# Patient Record
Sex: Male | Born: 1990 | Race: Black or African American | Hispanic: No | Marital: Single | Smoking: Never smoker
Health system: Southern US, Community
[De-identification: ages and names within clinical notes are randomized; demographics above are authoritative.]

## PROBLEM LIST (undated history)

## (undated) DIAGNOSIS — B20 Human immunodeficiency virus [HIV] disease: Secondary | ICD-10-CM

## (undated) DIAGNOSIS — E876 Hypokalemia: Secondary | ICD-10-CM

## (undated) DIAGNOSIS — Z21 Asymptomatic human immunodeficiency virus [HIV] infection status: Secondary | ICD-10-CM

## (undated) HISTORY — DX: Asymptomatic human immunodeficiency virus (hiv) infection status: Z21

## (undated) HISTORY — DX: Hypokalemia: E87.6

## (undated) HISTORY — DX: Human immunodeficiency virus (HIV) disease: B20

---
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
## 2011-05-04 ENCOUNTER — Inpatient Hospital Stay (INDEPENDENT_AMBULATORY_CARE_PROVIDER_SITE_OTHER)
Admission: RE | Admit: 2011-05-04 | Discharge: 2011-05-04 | Disposition: A | Discharge status: Home / Self Care | Payer: 59 | Source: Ambulatory Visit | Attending: Family Medicine | Admitting: Family Medicine

## 2011-05-04 DIAGNOSIS — K5289 Other specified noninfective gastroenteritis and colitis: Secondary | ICD-10-CM

## 2011-07-14 ENCOUNTER — Inpatient Hospital Stay (INDEPENDENT_AMBULATORY_CARE_PROVIDER_SITE_OTHER)
Admission: RE | Admit: 2011-07-14 | Discharge: 2011-07-14 | Disposition: A | Discharge status: Home / Self Care | Payer: 59 | Source: Ambulatory Visit | Attending: Family Medicine | Admitting: Family Medicine

## 2011-07-14 DIAGNOSIS — J029 Acute pharyngitis, unspecified: Secondary | ICD-10-CM

## 2011-07-14 LAB — POCT INFECTIOUS MONO SCREEN: Mono Screen: NEGATIVE

## 2011-07-14 LAB — POCT RAPID STREP A: Streptococcus, Group A Screen (Direct): NEGATIVE

## 2011-07-14 LAB — HIV ANTIBODY (ROUTINE TESTING W REFLEX): HIV: NONREACTIVE

## 2011-09-17 ENCOUNTER — Emergency Department: Payer: Self-pay | Admitting: Emergency Medicine

## 2011-09-25 ENCOUNTER — Emergency Department: Payer: Self-pay

## 2013-05-02 ENCOUNTER — Emergency Department: Payer: Self-pay | Admitting: Emergency Medicine

## 2013-05-02 LAB — COMPREHENSIVE METABOLIC PANEL
Alkaline Phosphatase: 106 U/L (ref 50–136)
BUN: 12 mg/dL (ref 7–18)
Bilirubin,Total: 1.7 mg/dL — ABNORMAL HIGH (ref 0.2–1.0)
Calcium, Total: 9.4 mg/dL (ref 8.5–10.1)
Chloride: 103 mmol/L (ref 98–107)
Glucose: 91 mg/dL (ref 65–99)
Osmolality: 269 (ref 275–301)
Potassium: 3 mmol/L — ABNORMAL LOW (ref 3.5–5.1)
SGOT(AST): 92 U/L — ABNORMAL HIGH (ref 15–37)
Sodium: 135 mmol/L — ABNORMAL LOW (ref 136–145)
Total Protein: 8.2 g/dL (ref 6.4–8.2)

## 2013-05-02 LAB — CBC
HCT: 45.5 % (ref 40.0–52.0)
MCH: 27.8 pg (ref 26.0–34.0)
MCHC: 35.7 g/dL (ref 32.0–36.0)
Platelet: 202 10*3/uL (ref 150–440)
RDW: 13.7 % (ref 11.5–14.5)

## 2013-05-12 ENCOUNTER — Emergency Department: Payer: Self-pay | Admitting: Emergency Medicine

## 2013-05-12 LAB — COMPREHENSIVE METABOLIC PANEL
Albumin: 4.1 g/dL (ref 3.4–5.0)
Anion Gap: 6 — ABNORMAL LOW (ref 7–16)
Bilirubin,Total: 0.9 mg/dL (ref 0.2–1.0)
Calcium, Total: 9.1 mg/dL (ref 8.5–10.1)
Co2: 27 mmol/L (ref 21–32)
EGFR (African American): >60
Osmolality: 272 (ref 275–301)
Potassium: 2.6 mmol/L — ABNORMAL LOW (ref 3.5–5.1)
SGPT (ALT): 127 U/L — ABNORMAL HIGH (ref 12–78)
Sodium: 135 mmol/L — ABNORMAL LOW (ref 136–145)

## 2013-05-12 LAB — CBC
HCT: 38.9 % — ABNORMAL LOW (ref 40.0–52.0)
HGB: 13.8 g/dL (ref 13.0–18.0)
MCH: 27.7 pg (ref 26.0–34.0)
MCV: 78 fL — ABNORMAL LOW (ref 80–100)
WBC: 10.2 10*3/uL (ref 3.8–10.6)

## 2013-05-12 LAB — LIPASE, BLOOD: Lipase: 92 U/L (ref 73–393)

## 2013-05-14 DIAGNOSIS — E876 Hypokalemia: Secondary | ICD-10-CM

## 2013-05-14 HISTORY — DX: Hypokalemia: E87.6

## 2013-05-19 ENCOUNTER — Telehealth: Payer: Self-pay | Admitting: Adult Health

## 2013-05-19 ENCOUNTER — Encounter: Payer: Self-pay | Admitting: Adult Health

## 2013-05-19 NOTE — Telephone Encounter (Signed)
Left message on home answering machine to call the office . I'm needing the patient to reschedule his appointment from 7.23.14 @ 8:30.

## 2013-05-23 ENCOUNTER — Telehealth: Payer: Self-pay | Admitting: *Deleted

## 2013-05-23 NOTE — Telephone Encounter (Signed)
Mother called stating patient was to have had new patient appt tomorrow but this was rescheduled to 7/29.  She is concerned because pt has been seen at [facility] twice this month for low potassium, the last time was last week and he was told to see you asap.  His potassium at hospital was 2.6, was given IV potassium and a script for a supplement, which he has finished.  Mother is asking if ok to wait till next week for patient to be seen.  Please advise. Phone number for pt is [phone].

## 2013-05-23 NOTE — Telephone Encounter (Signed)
Per Raquel, advised patient that he will not be able to have labs checked here until after he becomes established as a patient.  I suggested he go to his previous doctor, if he has one, but he says that would be his pediatrician, and he has aged out.  He states that he's ok with waiting until his visit here next week, but I advised him that if he starts feeling bad again he should go to urgent care for labs.  Patient agreed.

## 2013-05-23 NOTE — Telephone Encounter (Signed)
Unfortunately I cannot answer whether it is ok to wait until next week because I do not know the history of the patient. I do not have any new patient appointments until then.

## 2013-05-23 NOTE — Telephone Encounter (Signed)
Advised patient as instructed.  He's asking if he can at least come in and have potassium level checked prior to appt next week.

## 2013-05-24 ENCOUNTER — Ambulatory Visit: Payer: 59 | Admitting: Adult Health

## 2013-05-30 ENCOUNTER — Other Ambulatory Visit: Payer: Self-pay | Admitting: Adult Health

## 2013-05-30 ENCOUNTER — Ambulatory Visit (INDEPENDENT_AMBULATORY_CARE_PROVIDER_SITE_OTHER): Payer: 59 | Admitting: Adult Health

## 2013-05-30 ENCOUNTER — Encounter: Payer: Self-pay | Admitting: Adult Health

## 2013-05-30 VITALS — BP 108/62 | HR 74 | Temp 98.0°F | Resp 12 | Ht 69.0 in | Wt 132.0 lb

## 2013-05-30 DIAGNOSIS — Z0000 Encounter for general adult medical examination without abnormal findings: Secondary | ICD-10-CM

## 2013-05-30 LAB — CBC WITH DIFFERENTIAL/PLATELET
Basophils Relative: 0.7 % (ref 0.0–3.0)
Eosinophils Absolute: 0.1 10*3/uL (ref 0.0–0.7)
Hemoglobin: 14.3 g/dL (ref 13.0–17.0)
Lymphocytes Relative: 39.4 % (ref 12.0–46.0)
MCHC: 33.8 g/dL (ref 30.0–36.0)
MCV: 83.6 fl (ref 78.0–100.0)
Neutro Abs: 4 10*3/uL (ref 1.4–7.7)
RBC: 5.08 Mil/uL (ref 4.22–5.81)

## 2013-05-30 LAB — LIPID PANEL
HDL: 30.5 mg/dL — ABNORMAL LOW (ref >39.00)
Total CHOL/HDL Ratio: 5
VLDL: 28.8 mg/dL (ref 0.0–40.0)

## 2013-05-30 LAB — COMPREHENSIVE METABOLIC PANEL
AST: 19 U/L (ref 0–37)
Alkaline Phosphatase: 76 U/L (ref 39–117)
BUN: 9 mg/dL (ref 6–23)
Calcium: 9.2 mg/dL (ref 8.4–10.5)
Creatinine, Ser: 0.8 mg/dL (ref 0.4–1.5)
Total Bilirubin: 1 mg/dL (ref 0.3–1.2)

## 2013-05-30 MED ORDER — TETANUS-DIPHTH-ACELL PERTUSSIS 5-2.5-18.5 LF-MCG/0.5 IM SUSP: 0.5000 mL | Freq: Once | INTRAMUSCULAR | Status: DC

## 2013-05-30 MED ORDER — POTASSIUM CHLORIDE CRYS ER 20 MEQ PO TBCR: 20.0000 meq | EXTENDED_RELEASE_TABLET | Freq: Every day | ORAL | Status: DC

## 2013-05-30 NOTE — Progress Notes (Signed)
  Subjective:    Patient ID: Tyler Shannon, male    DOB: 25-Nov-1990, 22 y.o.   MRN: [id number]  HPI  Patient is a pleasant 22 y/o male who presents to clinic to establish care. Last MD was his pediatrician - Dr. Tracey Harries in [place]. [place] recently had food poisoning which resulted in n/v. He ended up in the ED. He was found to be hypokalemic. He was given supplements which he has completed.  Review of Systems  Constitutional: Negative.   HENT: Negative.   Eyes: Negative.   Respiratory: Negative.   Cardiovascular: Negative.   Gastrointestinal: Negative.   Endocrine: Negative.   Genitourinary: Negative.   Musculoskeletal: Negative.   Skin: Negative.   Allergic/Immunologic:       Seasonal allergies during high allergy season  Neurological: Negative.   Hematological: Negative.   Psychiatric/Behavioral: Negative.    BP 108/62  Pulse 74  Temp(Src) 98 F (36.7 C) (Oral)  Resp 12  Ht 5\' 9"  (1.753 m)  Wt 132 lb (59.875 kg)  BMI 19.48 kg/m2  SpO2 99%    Objective:   Physical Exam  Constitutional: He is oriented to person, place, and time. He appears well-developed and well-nourished. No distress.  HENT:  Head: Normocephalic and atraumatic.  Right Ear: External ear normal.  Left Ear: External ear normal.  Nose: Nose normal.  Mouth/Throat: Oropharynx is clear and moist.  Eyes: Conjunctivae and EOM are normal. Pupils are equal, round, and reactive to light.  Neck: Normal range of motion. Neck supple. No tracheal deviation present. No thyromegaly present.  Cardiovascular: Normal rate, regular rhythm, normal heart sounds and intact distal pulses.  Exam reveals no gallop and no friction rub.   No murmur heard. Pulmonary/Chest: Effort normal and breath sounds normal. No respiratory distress. He has no wheezes. He has no rales.  Abdominal: Soft. Bowel sounds are normal. He exhibits no distension and no mass. There is no tenderness. There is no rebound and no guarding.    Musculoskeletal: Normal range of motion. He exhibits no edema and no tenderness.  Lymphadenopathy:    He has no cervical adenopathy.  Neurological: He is alert and oriented to person, place, and time. He has normal reflexes. No cranial nerve deficit. Coordination normal.  Skin: Skin is warm and dry.     Psychiatric: He has a normal mood and affect. His behavior is normal. Judgment and thought content normal.          Assessment & Plan:

## 2013-05-30 NOTE — Addendum Note (Signed)
Addended by: Chandra Batch E on: 05/30/2013 10:33 AM   Modules accepted: Orders

## 2013-05-30 NOTE — Assessment & Plan Note (Addendum)
Normal physical exam. Check labs: CBC with differential, lipids, comprehensive metabolic panel. Patient with recent history of hypokalemia secondary to significant nausea and vomiting after food poisoning. He is feeling well. Obtain medical records from Dr. Tracey Harries. Patient will be given Tdap vaccine today. RTC in 1 year or sooner if needed

## 2013-05-30 NOTE — Patient Instructions (Addendum)
   Thank you for choosing [facility] for your health care needs.  Please have your labs drawn prior to leaving the office.  The results will be available through MyChart for your convenience. Please remember to activate this. The activation code is located at the end of this form.  Please call with any questions or concerns.

## 2013-06-01 ENCOUNTER — Other Ambulatory Visit: Payer: Self-pay | Admitting: *Deleted

## 2013-06-01 ENCOUNTER — Ambulatory Visit: Payer: 59 | Admitting: Adult Health

## 2013-06-01 MED ORDER — POTASSIUM CHLORIDE CRYS ER 20 MEQ PO TBCR: 20.0000 meq | EXTENDED_RELEASE_TABLET | Freq: Every day | ORAL | Status: DC

## 2013-06-01 NOTE — Telephone Encounter (Signed)
Rx changed to no refills per Orville Govern, NP. New Rx escripted to [facility].

## 2013-06-07 ENCOUNTER — Telehealth: Payer: Self-pay | Admitting: *Deleted

## 2013-06-07 ENCOUNTER — Other Ambulatory Visit: Payer: Self-pay | Admitting: Adult Health

## 2013-06-07 DIAGNOSIS — E876 Hypokalemia: Secondary | ICD-10-CM

## 2013-06-07 NOTE — Telephone Encounter (Signed)
potassium

## 2013-06-07 NOTE — Telephone Encounter (Signed)
Pt is coming in for labs work tomorrow 08.07.2014, what labs and dx?

## 2013-06-08 ENCOUNTER — Other Ambulatory Visit (INDEPENDENT_AMBULATORY_CARE_PROVIDER_SITE_OTHER): Payer: 59

## 2013-06-08 DIAGNOSIS — E876 Hypokalemia: Secondary | ICD-10-CM

## 2013-06-08 LAB — POTASSIUM: Potassium: 3.7 mEq/L (ref 3.5–5.1)

## 2013-06-09 ENCOUNTER — Encounter: Payer: Self-pay | Admitting: Adult Health

## 2013-08-14 ENCOUNTER — Ambulatory Visit: Payer: 59 | Admitting: Internal Medicine

## 2013-08-14 ENCOUNTER — Encounter: Payer: Self-pay | Admitting: Family Medicine

## 2013-08-14 ENCOUNTER — Telehealth (INDEPENDENT_AMBULATORY_CARE_PROVIDER_SITE_OTHER): Payer: 59 | Admitting: Family Medicine

## 2013-08-14 VITALS — BP 126/84 | HR 96 | Temp 98.4°F | Wt 134.0 lb

## 2013-08-14 DIAGNOSIS — A088 Other specified intestinal infections: Secondary | ICD-10-CM

## 2013-08-14 DIAGNOSIS — A084 Viral intestinal infection, unspecified: Secondary | ICD-10-CM | POA: Insufficient documentation

## 2013-08-14 DIAGNOSIS — Z113 Encounter for screening for infections with a predominantly sexual mode of transmission: Secondary | ICD-10-CM | POA: Insufficient documentation

## 2013-08-14 NOTE — Progress Notes (Signed)
  Subjective:    Patient ID: Tyler Shannon, male    DOB: May 28, 1991, 22 y.o.   MRN: [id number]  HPI CC: not feeling well.  Presents with sister today.  1d h/o diarrhea associated with headache and sweating.  Diarrhea x2-3/day - watery.  Last BM was yesterday morning.  No blood or mucous in stool.  When he awoke this morning - felt nauseated, persistent headache.  Took 2 advil this morning.  No diarrhea in last few days 2/2 not eating much.  Has slept most of the day last 2 days.  Tmax 101.5.  Appetite down, not eating much. Recently went to hospital with mom for kidney stone.  No sick contacts at home. No new foods or restaurants. Traveled to [place] 3 wks ago.  Denies vomiting, new rashes, no abd pain.  No viral URI sxs like cough or sneezing, congestion. Would like STD check - 3-4 partners in last year (male).  Unprotected x2.  Last void was 5 min ago.  No dysuria, urgency, frequency.  Past Medical History  Diagnosis Date  . Hypokalemia 05/14/13    Review of Systems Per HPI    Objective:   Physical Exam  Nursing note and vitals reviewed. Constitutional: He appears well-developed and well-nourished. No distress.  HENT:  Mouth/Throat: Oropharynx is clear and moist.  Cardiovascular: Normal rate, regular rhythm, normal heart sounds and intact distal pulses.   No murmur heard. Pulmonary/Chest: Effort normal and breath sounds normal. No respiratory distress. He has no wheezes. He has no rales.  Abdominal: Soft. Normal appearance and bowel sounds are normal. He exhibits no distension and no mass. There is no hepatosplenomegaly. There is no tenderness. There is no rigidity, no rebound, no guarding, no CVA tenderness and negative Murphy's sign.  thin  Genitourinary: Penis normal. Circumcised.  Musculoskeletal: He exhibits no edema.  Skin: Skin is warm and dry. No rash noted.  Psychiatric: He has a normal mood and affect.       Assessment & Plan:

## 2013-08-14 NOTE — Assessment & Plan Note (Signed)
Supportive care as per instructions.  Handout provided. Update if not improving as expected.

## 2013-08-14 NOTE — Assessment & Plan Note (Signed)
Reviewed high risk sexual activity, advised protection 100%. Advised possibility of false negative HIV test today depending on last episode of unprotected sex and possible need of repeating testing. CT/GC urethral probe, HIV,RPR blood test.

## 2013-08-14 NOTE — Patient Instructions (Signed)
STD screen today.  Careful with sex. I think you had viral gastroenteritis.  See below Viral Gastroenteritis Viral gastroenteritis is also known as stomach flu. This condition affects the stomach and intestinal tract. It can cause sudden diarrhea and vomiting. The illness typically lasts 3 to 8 days. Most people develop an immune response that eventually gets rid of the virus. While this natural response develops, the virus can make you quite ill. CAUSES  Many different viruses can cause gastroenteritis, such as rotavirus or noroviruses. You can catch one of these viruses by consuming contaminated food or water. You may also catch a virus by sharing utensils or other personal items with an infected person or by touching a contaminated surface. SYMPTOMS  The most common symptoms are diarrhea and vomiting. These problems can cause a severe loss of body fluids (dehydration) and a body salt (electrolyte) imbalance. Other symptoms may include:  Fever.  Headache.  Fatigue.  Abdominal pain. DIAGNOSIS  Your caregiver can usually diagnose viral gastroenteritis based on your symptoms and a physical exam. A stool sample may also be taken to test for the presence of viruses or other infections. TREATMENT  This illness typically goes away on its own. Treatments are aimed at rehydration. The most serious cases of viral gastroenteritis involve vomiting so severely that you are not able to keep fluids down. In these cases, fluids must be given through an intravenous line (IV). HOME CARE INSTRUCTIONS   Drink enough fluids to keep your urine clear or pale yellow. Drink small amounts of fluids frequently and increase the amounts as tolerated.  Ask your caregiver for specific rehydration instructions.  Avoid:  Foods high in sugar.  Alcohol.  Carbonated drinks.  Tobacco.  Juice.  Caffeine drinks.  Extremely hot or cold fluids.  Fatty, greasy foods.  Too much intake of anything at one  time.  Dairy products until 24 to 48 hours after diarrhea stops.  You may consume probiotics. Probiotics are active cultures of beneficial bacteria. They may lessen the amount and number of diarrheal stools in adults. Probiotics can be found in yogurt with active cultures and in supplements.  Wash your hands well to avoid spreading the virus.  Only take over-the-counter or prescription medicines for pain, discomfort, or fever as directed by your caregiver. Do not give aspirin to children. Antidiarrheal medicines are not recommended.  Ask your caregiver if you should continue to take your regular prescribed and over-the-counter medicines.  Keep all follow-up appointments as directed by your caregiver. SEEK IMMEDIATE MEDICAL CARE IF:   You are unable to keep fluids down.  You do not urinate at least once every 6 to 8 hours.  You develop shortness of breath.  You notice blood in your stool or vomit. This may look like coffee grounds.  You have abdominal pain that increases or is concentrated in one small area (localized).  You have persistent vomiting or diarrhea.  You have a fever.  The patient is a child younger than 3 months, and he or she has a fever.  The patient is a child older than 3 months, and he or she has a fever and persistent symptoms.  The patient is a child older than 3 months, and he or she has a fever and symptoms suddenly get worse.  The patient is a baby, and he or she has no tears when crying. MAKE SURE YOU:   Understand these instructions.  Will watch your condition.  Will get help right away if you  are not doing well or get worse. Document Released: 10/19/2005 Document Revised: 01/11/2012 Document Reviewed: 08/05/2011 [facility] Patient Information 2014 [place].

## 2013-08-15 LAB — GC/CHLAMYDIA PROBE AMP: CT Probe RNA: NEGATIVE

## 2013-08-16 ENCOUNTER — Ambulatory Visit (INDEPENDENT_AMBULATORY_CARE_PROVIDER_SITE_OTHER): Payer: 59 | Admitting: Internal Medicine

## 2013-08-16 ENCOUNTER — Encounter: Payer: Self-pay | Admitting: Internal Medicine

## 2013-08-16 VITALS — BP 100/60 | HR 74 | Temp 97.6°F | Wt 133.0 lb

## 2013-08-16 DIAGNOSIS — R509 Fever, unspecified: Secondary | ICD-10-CM

## 2013-08-16 DIAGNOSIS — R634 Abnormal weight loss: Secondary | ICD-10-CM

## 2013-08-16 LAB — CBC WITH DIFFERENTIAL/PLATELET
Basophils Absolute: 0 10*3/uL (ref 0.0–0.1)
HCT: 44.2 % (ref 39.0–52.0)
Lymphs Abs: 1.2 10*3/uL (ref 0.7–4.0)
MCHC: 34.4 g/dL (ref 30.0–36.0)
MCV: 81.6 fl (ref 78.0–100.0)
Monocytes Absolute: 0.6 10*3/uL (ref 0.1–1.0)
Platelets: 115 10*3/uL — ABNORMAL LOW (ref 150.0–400.0)
RDW: 13.3 % (ref 11.5–14.6)

## 2013-08-16 LAB — SEDIMENTATION RATE: Sed Rate: 8 mm/hr (ref 0–22)

## 2013-08-16 LAB — BASIC METABOLIC PANEL
BUN: 6 mg/dL (ref 6–23)
CO2: 30 mEq/L (ref 19–32)
Chloride: 99 mEq/L (ref 96–112)
Glucose, Bld: 104 mg/dL — ABNORMAL HIGH (ref 70–99)
Potassium: 3.9 mEq/L (ref 3.5–5.1)

## 2013-08-16 LAB — TSH: TSH: 1.25 u[IU]/mL (ref 0.35–5.50)

## 2013-08-16 LAB — HEPATIC FUNCTION PANEL
Albumin: 4.3 g/dL (ref 3.5–5.2)
Total Protein: 7.6 g/dL (ref 6.0–8.3)

## 2013-08-16 MED ORDER — ONDANSETRON HCL 4 MG PO TABS: 4.0000 mg | ORAL_TABLET | Freq: Three times a day (TID) | ORAL | Status: DC | PRN

## 2013-08-16 NOTE — Assessment & Plan Note (Signed)
Will check labs Not sure if low potassium could be related--- his sister and mom have also had low potassium

## 2013-08-16 NOTE — Assessment & Plan Note (Signed)
Seems to be viral syndrome Headache, nausea without vomiting except for this AM No rash, significant respiratory symptoms, foreign travel or apparent exposures  ?mosquito borne illness No evidence of bacterial infection Has had some weight loss and puzzling hypokalemia Will recheck labs

## 2013-08-16 NOTE — Progress Notes (Signed)
Subjective:    Patient ID: Tyler Shannon, male    DOB: 1991-02-27, 22 y.o.   MRN: [id number]  HPI Woke this morning with "the worse headache I have ever had" Severe frontal pain that woke him These first started 2 days ago though Went to bathroom and started dry heaving Then tried 2 advil--- only very briefly helps the headache  Still has sweats at night and chills Some nasal drainge--and post nasal drip May have seen some blood in the drainage  Dizzy this AM Temp up to 100---seems to go up in the evening ---even as high as 103  Started 3-4 days ago Nausea for this period but only dry heaving today Has tried to eat--but appetite is off Able to drink okay---taking powerade mostlly  No sig cough No SOB No rash  Current Outpatient Prescriptions on File Prior to Visit  Medication Sig Dispense Refill  . ondansetron (ZOFRAN) 4 MG tablet Take 1 tablet (4 mg total) by mouth every 8 (eight) hours as needed for nausea.  20 tablet  0  . potassium chloride SA (K-DUR,KLOR-CON) 20 MEQ tablet Take 1 tablet (20 mEq total) by mouth daily.  30 tablet  0   No current facility-administered medications on file prior to visit.    Allergies  Allergen Reactions  . Lorabid [Loracarbef] Rash    Past Medical History  Diagnosis Date  . Hypokalemia 05/14/13    Past Surgical History  Procedure Laterality Date  . Tonsillectomy and adenoidectomy  2007    Family History  Problem Relation Age of Onset  . Hypertension Mother   . Asthma Sister   . Arthritis Maternal Grandmother   . Hypertension Maternal Grandmother   . Diabetes Maternal Grandmother   . Cancer Maternal Grandfather     lung ca    History   Social History  . Marital Status: Single    Spouse Name: N/A    Number of Children: N/A  . Years of Education: N/A   Occupational History  . Not on file.   Social History Main Topics  . Smoking status: Never Smoker   . Smokeless tobacco: Never Used  . Alcohol Use: Yes   Comment: Occassionally has a drink - beer or mixed drink  . Drug Use: No  . Sexual Activity: No   Other Topics Concern  . Not on file   Social History Narrative   Tyler Shannon grew up in [place], [place]. He currently lives at home with his mother and sister. He is currently unemployed. 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 He enjoys traveling, hanging out with friends, watching movies.    Review of Systems Has had trouble keeping his potassium up---mother and sister with similar problems No foreign travel No known tick or mosquito bites Diarrhea has resolved    Objective:   Physical Exam  Constitutional: He appears well-developed and well-nourished. No distress.  HENT:  No sinus tenderness Mild nasal inflammation Mild pharyngeal injection without tonsillar enlargement or exudates  Neck: Normal range of motion. Neck supple. No thyromegaly present.  Full active and passive ROM  Pulmonary/Chest: Effort normal and breath sounds normal. No respiratory distress. He has no wheezes. He has no rales.  Abdominal: Soft. Bowel sounds are normal. He exhibits no distension and no mass. There is no tenderness. There is no rebound and no guarding.  Musculoskeletal: He exhibits no edema and no tenderness.  No joint swelling or tenderness  Lymphadenopathy:    He has  no cervical adenopathy.  Skin: No rash noted.  Psychiatric: He has a normal mood and affect. His behavior is normal.          Assessment & Plan:

## 2013-08-16 NOTE — Addendum Note (Signed)
Addended by: Eustaquio Boyden on: 08/16/2013 01:41 PM   Modules accepted: Orders

## 2013-09-07 ENCOUNTER — Other Ambulatory Visit: Payer: Self-pay

## 2013-10-30 ENCOUNTER — Ambulatory Visit: Payer: 59 | Admitting: Adult Health

## 2013-11-03 ENCOUNTER — Encounter: Payer: Self-pay | Admitting: Internal Medicine

## 2013-11-03 ENCOUNTER — Ambulatory Visit (INDEPENDENT_AMBULATORY_CARE_PROVIDER_SITE_OTHER): Payer: 59 | Admitting: Internal Medicine

## 2013-11-03 VITALS — BP 120/70 | HR 87 | Temp 99.9°F | Wt 142.0 lb

## 2013-11-03 DIAGNOSIS — B86 Scabies: Secondary | ICD-10-CM

## 2013-11-03 DIAGNOSIS — R21 Rash and other nonspecific skin eruption: Secondary | ICD-10-CM

## 2013-11-03 DIAGNOSIS — Z113 Encounter for screening for infections with a predominantly sexual mode of transmission: Secondary | ICD-10-CM

## 2013-11-03 DIAGNOSIS — B851 Pediculosis due to Pediculus humanus corporis: Secondary | ICD-10-CM | POA: Insufficient documentation

## 2013-11-03 LAB — RPR

## 2013-11-03 MED ORDER — PERMETHRIN 5 % EX CREA: 1.0000 "application " | TOPICAL_CREAM | Freq: Once | CUTANEOUS | Status: DC

## 2013-11-03 NOTE — Patient Instructions (Signed)
Scabies Scabies Scabies are small bugs (mites) that burrow under the skin and cause red bumps and severe itching. These bugs can only be seen with a microscope. Scabies are highly contagious. They can spread easily from person to person by direct contact. They are also spread through sharing clothing or linens that have the scabies mites living in them. It is not unusual for an entire family to become infected through shared towels, clothing, or bedding.  HOME CARE INSTRUCTIONS   Your caregiver may prescribe a cream or lotion to kill the mites. If cream is prescribed, massage the cream into the entire body from the neck to the bottom of both feet. Also massage the cream into the scalp and face if your child is less than 22 year old. Avoid the eyes and mouth. Do not wash your hands after application.  Leave the cream on for 8 to 12 hours. Your child should bathe or shower after the 8 to 12 hour application period. Sometimes it is helpful to apply the cream to your child right before bedtime.  One treatment is usually effective and will eliminate approximately 95% of infestations. For severe cases, your caregiver may decide to repeat the treatment in 1 week. Everyone in your household should be treated with one application of the cream.  New rashes or burrows should not appear within 24 to 48 hours after successful treatment. However, the itching and rash may last for 2 to 4 weeks after successful treatment. Your caregiver may prescribe a medicine to help with the itching or to help the rash go away more quickly.  Scabies can live on clothing or linens for up to 3 days. All of your child's recently used clothing, towels, stuffed toys, and bed linens should be washed in hot water and then dried in a dryer for at least 20 minutes on high heat. Items that cannot be washed should be enclosed in a plastic bag for at least 3 days.  To help relieve itching, bathe your child in a cool bath or apply cool  washcloths to the affected areas.  Your child may return to school after treatment with the prescribed cream. SEEK MEDICAL CARE IF:   The itching persists longer than 4 weeks after treatment.  The rash spreads or becomes infected. Signs of infection include red blisters or yellow-tan crust. Document Released: 10/19/2005 Document Revised: 01/11/2012 Document Reviewed: 02/27/2009 [facility] Patient Information 2014 [place].

## 2013-11-03 NOTE — Progress Notes (Signed)
Patient ID: Tyler Shannon, male   DOB: [birth year], 23 y.o.   MRN: [id number]   Patient Active Problem List   Diagnosis Date Noted  . 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 Scabies infestation 11/03/2013  . Fever 08/16/2013  . Loss of weight 08/16/2013  . Screen for STD (sexually transmitted disease) 08/14/2013  . Viral enteritis 08/14/2013  . Routine general medical examination at a health care facility 05/30/2013    Subjective:  CC:   Chief Complaint  Patient presents with  . Rash    HPI:   Tyler Shannon a 23 y.o. male who presents  With a pruritic rash.  Symptom started One month ago in the pubic region in the hair follicles.  Mild itching a few red bumps, which initially resolved.  Then recurred about two weeks ago and spread to arms and legs, and now involves the skin on his penile shaft, the webbing between his fingers,  And even a spot or two on both palms, scalp and face not involved.  Patient is sexually active, engages in homosexual behavior but denies any similar symptoms in his sex partners.  Thinks he may have had contact with a co employee at work ([employer]) who was reporting itching a few weeks ago.  Has not stayed in any hotels, flop houses or sleeping bags/    Past Medical History  Diagnosis Date  . Hypokalemia 05/14/13    Past Surgical History  Procedure Laterality Date  . Tonsillectomy and adenoidectomy  2007       The following portions of the patient's history were reviewed and updated as appropriate: Allergies, current medications, and problem list.    Review of Systems:   12 Pt  review of systems was negative except those addressed in the HPI,     History   Social History  . Marital Status: Single    Spouse Name: N/A    Number of Children: N/A  . Years of Education: N/A   Occupational History  . Not on file.   Social History Main Topics  . Smoking status: Never Smoker   . Smokeless tobacco: Never Used  . Alcohol Use: Yes     Comment:  Occassionally has a drink - beer or mixed drink  . Drug Use: No  . Sexual Activity: No   Other Topics Concern  . Not on file   Social History Narrative   Tyler Shannon grew up in [place], [place]. He currently lives at home with his mother and sister. He is currently unemployed. 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 He enjoys traveling, hanging out with friends, watching movies.     Objective:  Filed Vitals:   11/03/13 1357  BP: 120/70  Pulse: 87  Temp: 99.9 F (37.7 C)     General appearance: alert, cooperative and appears stated age Ears: normal TM's and external ear canals both ears Throat: lips, mucosa, and tongue normal; teeth and gums normal Neck: no adenopathy, no carotid bruit, supple, symmetrical, trachea midline and thyroid not enlarged, symmetric, no tenderness/mass/nodules Back: symmetric, no curvature. ROM normal. No CVA tenderness. Lungs: clear to auscultation bilaterally Heart: regular rate and rhythm, S1, S2 normal, no murmur, click, rub or gallop Abdomen: soft, non-tender; bowel sounds normal; no masses,  no organomegaly Pulses: 2+ and symmetric Skin: diffuse papular rash covering arms,l legs, truck and pubic region.  Interdigital spaces and palsm also affected.   Lymph nodes: Cervical, supraclavicular, and axillary nodes normal.  Assessment  and Plan:  Scabies infestation Printed handout given on how to clean environment and rx for permethrin cream sent to pharmacy.  Advised repeat testing for syphilis given the involvement in palms. He was tested for other STDS less than 3 months ago and all was negative.   Screen for STD (sexually transmitted disease) Repeat RPR is pending.    Updated Medication List Outpatient Encounter Prescriptions as of 11/03/2013  Medication Sig  . potassium chloride SA (K-DUR,KLOR-CON) 20 MEQ tablet Take 1 tablet (20 mEq total) by mouth daily.  . permethrin (ACTICIN) 5 % cream Apply 1 application topically once.  .  [DISCONTINUED] ondansetron (ZOFRAN) 4 MG tablet Take 1 tablet (4 mg total) by mouth every 8 (eight) hours as needed for nausea.     Orders Placed This Encounter  Procedures  . RPR    No Follow-up on file.

## 2013-11-03 NOTE — Progress Notes (Signed)
Pre-visit discussion using our clinic review tool. No additional management support is needed unless otherwise documented below in the visit note.  

## 2013-11-05 ENCOUNTER — Encounter: Payer: Self-pay | Admitting: Internal Medicine

## 2013-11-05 NOTE — Assessment & Plan Note (Signed)
Repeat RPR is pending.

## 2013-11-05 NOTE — Assessment & Plan Note (Signed)
Printed handout given on how to clean environment and rx for permethrin cream sent to pharmacy.  Advised repeat testing for syphilis given the involvement in palms. He was tested for other STDS less than 3 months ago and all was negative.

## 2013-11-07 MED ORDER — PERMETHRIN 5 % EX CREA: 1.0000 "application " | TOPICAL_CREAM | Freq: Once | CUTANEOUS | Status: DC

## 2013-11-17 ENCOUNTER — Encounter: Payer: Self-pay | Admitting: Internal Medicine

## 2013-11-17 ENCOUNTER — Ambulatory Visit (INDEPENDENT_AMBULATORY_CARE_PROVIDER_SITE_OTHER): Payer: 59 | Admitting: Internal Medicine

## 2013-11-17 VITALS — BP 108/64 | HR 84 | Temp 98.2°F | Resp 16 | Wt 140.5 lb

## 2013-11-17 DIAGNOSIS — L270 Generalized skin eruption due to drugs and medicaments taken internally: Secondary | ICD-10-CM

## 2013-11-17 DIAGNOSIS — T50904A Poisoning by unspecified drugs, medicaments and biological substances, undetermined, initial encounter: Secondary | ICD-10-CM

## 2013-11-17 DIAGNOSIS — B86 Scabies: Secondary | ICD-10-CM

## 2013-11-17 MED ORDER — HYDROXYZINE HCL 25 MG PO TABS: 25.0000 mg | ORAL_TABLET | Freq: Three times a day (TID) | ORAL | Status: DC | PRN

## 2013-11-17 NOTE — Patient Instructions (Addendum)
I believe your infestation is resolved, but your skin is very irritated because you have been overusing the Permethrin cream.    You need to stop using it and start using a calming moisturizer.  Please apply Aveeno with oatmeal moisturizer to your skin daily after your shower.  Use Dove or a similar bodywash (moisturizer) in the shower.     I will call in hydroxyzine for your itching, and am making a referral to a dermatologist in the event that this does not improve you skin

## 2013-11-17 NOTE — Progress Notes (Signed)
Patient ID: Tyler Shannon, male   DOB: 07-12-1991, 23 y.o.   MRN: [id number]   Patient Active Problem List   Diagnosis Date Noted  . 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 Scabies infestation 11/03/2013  . Fever 08/16/2013  . Loss of weight 08/16/2013  . Screen for STD (sexually transmitted disease) 08/14/2013  . Viral enteritis 08/14/2013  . Routine general medical examination at a health care facility 05/30/2013    Subjective:  CC:   Chief Complaint  Patient presents with  . Acute Visit    rash bilateral.    HPI:   Tyler Shannon a 23 y.o. male who presents Two-week followup on scabies infestation. He was prescribed permethrin for diffuse skin rash involving pubic area trunk and arms legs and webbing between fingers.He  has apparently been applying the permethrin and daily for the last 2 weeks. He continues to have itching. His mother has longer all sheets blankets and close as directed and has taken unnecessary steps at home. He has no new lesions. He has noticed that the spots on his hands it cleared up but continues to have persistent itching.    Past Medical History  Diagnosis Date  . Hypokalemia 05/14/13    Past Surgical History  Procedure Laterality Date  . Tonsillectomy and adenoidectomy  2007       The following portions of the patient's history were reviewed and updated as appropriate: Allergies, current medications, and problem list.    Review of Systems:   Patient denies headache, fevers, malaise, unintentional weight loss, skin rash, eye pain, sinus congestion and sinus pain, sore throat, dysphagia,  hemoptysis , cough, dyspnea, wheezing, chest pain, palpitations, orthopnea, edema, abdominal pain, nausea, melena, diarrhea, constipation, flank pain, dysuria, hematuria, urinary  Frequency, nocturia, numbness, tingling, seizures,  Focal weakness, Loss of consciousness,  Tremor, insomnia, depression, anxiety, and suicidal ideation.     History   Social  History  . Marital Status: Single    Spouse Name: N/A    Number of Children: N/A  . Years of Education: N/A   Occupational History  . Not on file.   Social History Main Topics  . Smoking status: Never Smoker   . Smokeless tobacco: Never Used  . Alcohol Use: Yes     Comment: Occassionally has a drink - beer or mixed drink  . Drug Use: No  . Sexual Activity: Homosexual   Other Topics Concern  . Not on file   Social History Narrative   Tyler Shannon grew up in [place], [place]. He currently lives at home with his mother and sister. He is currently working at [employer].  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 He enjoys traveling, hanging out with friends, watching movies.     Objective:  Filed Vitals:   11/17/13 0812  BP: 108/64  Pulse: 84  Temp: 98.2 F (36.8 C)  Resp: 16     General appearance: alert, cooperative and appears stated age Neck: no adenopathy, no carotid bruit, supple, symmetrical, trachea midline and thyroid not enlarged, symmetric, no tenderness/mass/nodules Back: symmetric, no curvature. ROM normal. No CVA tenderness. Lungs: clear to auscultation bilaterally Heart: regular rate and rhythm, S1, S2 normal, no murmur, click, rub or gallop Abdomen: soft, non-tender; bowel sounds normal; no masses,  no organomegaly Skin: Diffuse resolving macular rash marked by excoriations covering arms legs and torso. Palms are spared interdigital spaces are now clear. Lymph nodes: Cervical, supraclavicular, and axillary nodes normal.  Assessment and Plan:  Scabies infestation Versus body lice. I believe that his infestation has been resolved. I believe his persistent dermatitis is due to overuse of permethrin cream. He misunderstood the directions and has been applying it every night and leaving on for 8 hours. I've discontinued the permethrin and suggested that he use a mild body wash soap such as Dove and moisturizers skin afterwards with Aveeno moisturizer with  oatmeal or Eucerin. I have tentatively referred him to dermatology in the event that the dermatitis does not clear up with these measures.   Updated Medication List Outpatient Encounter Prescriptions as of 11/17/2013  Medication Sig  . permethrin (ACTICIN) 5 % cream Apply 1 application topically once.  . potassium chloride SA (K-DUR,KLOR-CON) 20 MEQ tablet Take 1 tablet (20 mEq total) by mouth daily.  . hydrOXYzine (ATARAX/VISTARIL) 25 MG tablet Take 1 tablet (25 mg total) by mouth 3 (three) times daily as needed. For itching     Orders Placed This Encounter  Procedures  . Ambulatory referral to Dermatology    No Follow-up on file.

## 2013-11-19 NOTE — Assessment & Plan Note (Signed)
Versus body lice. I believe that his infestation has been resolved. I believe his persistent dermatitis is due to overuse of permethrin cream. He misunderstood the directions and has been applying it every night and leaving on for 8 hours. I've discontinued the permethrin and suggested that he use a mild body wash soap such as Dove and moisturizers skin afterwards with Aveeno moisturizer with oatmeal or Eucerin. I have tentatively referred him to dermatology in the event that the dermatitis does not clear up with these measures.

## 2013-11-22 ENCOUNTER — Encounter: Payer: Self-pay | Admitting: Emergency Medicine

## 2013-12-08 ENCOUNTER — Ambulatory Visit (INDEPENDENT_AMBULATORY_CARE_PROVIDER_SITE_OTHER): Payer: 59 | Admitting: Internal Medicine

## 2013-12-08 ENCOUNTER — Encounter: Payer: Self-pay | Admitting: Internal Medicine

## 2013-12-08 VITALS — BP 124/68 | HR 85 | Temp 98.6°F | Resp 16 | Wt 142.2 lb

## 2013-12-08 DIAGNOSIS — N4889 Other specified disorders of penis: Secondary | ICD-10-CM

## 2013-12-08 DIAGNOSIS — E876 Hypokalemia: Secondary | ICD-10-CM

## 2013-12-08 DIAGNOSIS — N485 Ulcer of penis: Secondary | ICD-10-CM

## 2013-12-08 LAB — CBC WITH DIFFERENTIAL/PLATELET
BASOS PCT: 0.3 % (ref 0.0–3.0)
Basophils Absolute: 0 10*3/uL (ref 0.0–0.1)
EOS ABS: 0.2 10*3/uL (ref 0.0–0.7)
Eosinophils Relative: 1.8 % (ref 0.0–5.0)
HEMATOCRIT: 42.3 % (ref 39.0–52.0)
HEMOGLOBIN: 14 g/dL (ref 13.0–17.0)
LYMPHS ABS: 2.1 10*3/uL (ref 0.7–4.0)
LYMPHS PCT: 21.1 % (ref 12.0–46.0)
MCHC: 33.2 g/dL (ref 30.0–36.0)
MCV: 83.3 fl (ref 78.0–100.0)
Monocytes Absolute: 0.7 10*3/uL (ref 0.1–1.0)
Monocytes Relative: 7.1 % (ref 3.0–12.0)
NEUTROS ABS: 6.8 10*3/uL (ref 1.4–7.7)
Neutrophils Relative %: 69.7 % (ref 43.0–77.0)
Platelets: 220 10*3/uL (ref 150.0–400.0)
RBC: 5.07 Mil/uL (ref 4.22–5.81)
RDW: 13.1 % (ref 11.5–14.6)
WBC: 9.8 10*3/uL (ref 4.5–10.5)

## 2013-12-08 LAB — BASIC METABOLIC PANEL
BUN: 9 mg/dL (ref 6–23)
CALCIUM: 9.5 mg/dL (ref 8.4–10.5)
CO2: 27 meq/L (ref 19–32)
CREATININE: 0.78 mg/dL (ref 0.50–1.35)
Chloride: 105 mEq/L (ref 96–112)
GLUCOSE: 87 mg/dL (ref 70–99)
Potassium: 4.1 mEq/L (ref 3.5–5.3)
Sodium: 140 mEq/L (ref 135–145)

## 2013-12-08 NOTE — Patient Instructions (Signed)
I am referring you to an Infectious Disease Specialist to help us diagnose and resolve this rash.  Amber will contact you with an appt,

## 2013-12-08 NOTE — Progress Notes (Signed)
Patient ID: Tyler Shannon Pham, male   DOB: [birth year], 23 y.o.   MRN: [id number]  Patient Active Problem List   Diagnosis Date Noted  . Penile ulcer 12/09/2013  . 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 Scabies infestation 11/03/2013  . Fever 08/16/2013  . Loss of weight 08/16/2013  . Screen for STD (sexually transmitted disease) 08/14/2013  . Routine general medical examination at a health care facility 05/30/2013    Subjective:  CC:   Chief Complaint  Patient presents with  . Rash    has returned on groin area and penis    HPI:   Tyler Shannon Calixto is a 23 y.o. male who presents for  Evaluation of Penile Ulcers. Patient was treated for scabies vs pediculosis a month ago with permethrin, no nits were seen at the time and he had diffuse excoriations covering arms, legs and trunk with initial presentation in pubic area.  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  He returns today stating that the rash on the rest of his body has resolved. But he has developed several bumps on his penis that he presumes are again scabies.   .      Past Medical History  Diagnosis Date  . Hypokalemia 05/14/13    Past Surgical History  Procedure Laterality Date  . Tonsillectomy and adenoidectomy  2007       The following portions of the patient's history were reviewed and updated as appropriate: Allergies, current medications, and problem list.    Review of Systems:   Patient denies headache, fevers, malaise, unintentional weight loss, skin rash, eye pain, sinus congestion and sinus pain, sore throat, dysphagia,  hemoptysis , cough, dyspnea, wheezing, chest pain, palpitations, orthopnea, edema, abdominal pain, nausea, melena, diarrhea, constipation, flank pain, dysuria, hematuria, urinary  Frequency, nocturia, numbness, tingling, seizures,  Focal weakness, Loss of consciousness,  Tremor, insomnia, depression, anxiety, and suicidal ideation.     History   Social History  . Marital Status: Single   Spouse Name: N/A    Number of Children: N/A  . Years of Education: N/A   Occupational History  . Not on file.   Social History Main Topics  . Smoking status: Never Smoker   . Smokeless tobacco: Never Used  . Alcohol Use: Yes     Comment: Occassionally has a drink - beer or mixed drink  . Drug Use: No  . Sexual Activity: No   Other Topics Concern  . Not on file   Social History Narrative   Selena BattenCody grew up in [place], [place]. He currently lives at home with his mother and sister. He is currently unemployed. 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 He enjoys traveling, hanging out with friends, watching movies.     Objective:  Filed Vitals:   12/08/13 1333  BP: 124/68  Pulse: 85  Temp: 98.6 F (37 C)  Resp: 16     General appearance: alert, cooperative and appears stated age Ears: normal TM's and external ear canals both ears Throat: lips, mucosa, and tongue normal; teeth and gums normal Neck: no adenopathy, no carotid bruit, supple, symmetrical, trachea midline and thyroid not enlarged, symmetric, no tenderness/mass/nodules Back: symmetric, no curvature. ROM normal. No CVA tenderness. Lungs: clear to auscultation bilaterally Heart: regular rate and rhythm, S1, S2 normal, no murmur, click, rub or gallop Abdomen: soft, non-tender; bowel sounds normal; no masses,  no organomegaly Pulses: 2+ and symmetric Skin: Skin color, texture, turgor normal except for mutli[ple small  superficial ulcers on penile shaft.. No rashes or lesions Lymph nodes: Cervical, supraclavicular, and axillary nodes normal.  Assessment and Plan:  Penile ulcer His ulcers are superficial, clean based, and not painful.  They may have been self inflicted since they are all in the same stages and occurred after him picking at the initial papule. I have attempted to obtain a Tzack smear.   RPR ahd HSV also sent.,  Referral to ID advised.     Updated Medication List Outpatient Encounter  Prescriptions as of 12/08/2013  Medication Sig  . hydrOXYzine (ATARAX/VISTARIL) 25 MG tablet Take 1 tablet (25 mg total) by mouth 3 (three) times daily as needed. For itching  . permethrin (ACTICIN) 5 % cream Apply 1 application topically once.  . potassium chloride SA (K-DUR,KLOR-CON) 20 MEQ tablet Take 1 tablet (20 mEq total) by mouth daily.     Orders Placed This Encounter  Procedures  . Herpes simplex virus culture  . Micro Review  . RPR  . CBC with Differential  . HSV(herpes simplex vrs) 1+2 ab-IgG  . Basic Metabolic Panel (BMET)  . Ambulatory referral to Infectious Disease    No Follow-up on file.

## 2013-12-08 NOTE — Progress Notes (Signed)
Pre-visit discussion using our clinic review tool. No additional management support is needed unless otherwise documented below in the visit note.  

## 2013-12-09 ENCOUNTER — Encounter: Payer: Self-pay | Admitting: Internal Medicine

## 2013-12-09 DIAGNOSIS — N485 Ulcer of penis: Secondary | ICD-10-CM | POA: Insufficient documentation

## 2013-12-09 LAB — RPR

## 2013-12-09 NOTE — Assessment & Plan Note (Signed)
His ulcers are superficial, clean based, and not painful.  They may have been self inflicted since they are all in the same stages and occurred after him picking at the initial papule. I have attempted to obtain a Tzack smear.   RPR ahd HSV also sent.,  Referral to ID advised.

## 2013-12-11 ENCOUNTER — Encounter: Payer: Self-pay | Admitting: Internal Medicine

## 2013-12-11 LAB — HSV(HERPES SIMPLEX VRS) I + II AB-IGG
HSV 1 Glycoprotein G Ab, IgG: 0.56 IV
HSV 2 GLYCOPROTEIN G AB, IGG: 0.77 IV

## 2013-12-11 LAB — HERPES SIMPLEX VIRUS CULTURE: Organism ID, Bacteria: NOT DETECTED

## 2013-12-12 ENCOUNTER — Encounter: Payer: Self-pay | Admitting: Internal Medicine

## 2014-02-27 ENCOUNTER — Emergency Department: Payer: Self-pay | Admitting: Emergency Medicine

## 2014-02-27 ENCOUNTER — Telehealth: Payer: Self-pay | Admitting: Internal Medicine

## 2014-02-27 LAB — COMPREHENSIVE METABOLIC PANEL
ALK PHOS: 76 U/L
ANION GAP: 7 (ref 7–16)
Albumin: 4.3 g/dL (ref 3.4–5.0)
BILIRUBIN TOTAL: 0.6 mg/dL (ref 0.2–1.0)
BUN: 11 mg/dL (ref 7–18)
CALCIUM: 9.9 mg/dL (ref 8.5–10.1)
CHLORIDE: 106 mmol/L (ref 98–107)
CREATININE: 0.83 mg/dL (ref 0.60–1.30)
Co2: 27 mmol/L (ref 21–32)
EGFR (African American): >60
Glucose: 99 mg/dL (ref 65–99)
Osmolality: 279 (ref 275–301)
Potassium: 3.6 mmol/L (ref 3.5–5.1)
SGOT(AST): 12 U/L — ABNORMAL LOW (ref 15–37)
SGPT (ALT): 18 U/L (ref 12–78)
SODIUM: 140 mmol/L (ref 136–145)
Total Protein: 8.3 g/dL — ABNORMAL HIGH (ref 6.4–8.2)

## 2014-02-27 LAB — CBC WITH DIFFERENTIAL/PLATELET
BASOS PCT: 0.6 %
Basophil #: 0.1 10*3/uL (ref 0.0–0.1)
EOS PCT: 1.6 %
Eosinophil #: 0.2 10*3/uL (ref 0.0–0.7)
HCT: 42.4 % (ref 40.0–52.0)
HGB: 14.3 g/dL (ref 13.0–18.0)
Lymphocyte #: 1.9 10*3/uL (ref 1.0–3.6)
Lymphocyte %: 18.4 %
MCH: 27.6 pg (ref 26.0–34.0)
MCHC: 33.7 g/dL (ref 32.0–36.0)
MCV: 82 fL (ref 80–100)
MONOS PCT: 7.2 %
Monocyte #: 0.8 x10 3/mm (ref 0.2–1.0)
Neutrophil #: 7.6 10*3/uL — ABNORMAL HIGH (ref 1.4–6.5)
Neutrophil %: 72.2 %
Platelet: 238 10*3/uL (ref 150–440)
RBC: 5.17 10*6/uL (ref 4.40–5.90)
RDW: 14.4 % (ref 11.5–14.5)
WBC: 10.5 10*3/uL (ref 3.8–10.6)

## 2014-02-27 NOTE — Telephone Encounter (Signed)
Patient Information:  Caller Name: Tyler BattenCody  Phone: [phone]  Patient: Tyler Shannon, Tyler Shannon  Gender: Male  DOB: January 07, 1991  Age: [age] Years  PCP: Duncan Dullullo, Teresa (Adults only)  Office Follow Up:  Does the office need to follow up with this patient?: No  Instructions For The Office: N/A  RN Note:  no appts in epic; will use [facility]  Symptoms  Reason For Call & Symptoms: c/o stomach cramps on Sunday; started with night sweats last night; diagnosed with low Potassium last yr and these sxs are similar to what he has had in the past and was hospitalized; was told to take Potassium with sxs onset; started Potassium 20meq daily yesterday; c/o nausea, but denies vomiting  Reviewed Health History In EMR: Yes  Reviewed Medications In EMR: Yes  Reviewed Allergies In EMR: Yes  Reviewed Surgeries / Procedures: Yes  Date of Onset of Symptoms: 02/25/2014  Treatments Tried: Potassium  Treatments Tried Worked: No  Guideline(s) Used:  Abdominal Pain - Male  Disposition Per Guideline:   Go to ED Now  Reason For Disposition Reached:   Severe abdominal pain (e.g., excruciating)  Advice Given:  N/A  Patient Will Follow Care Advice:  YES

## 2014-03-01 ENCOUNTER — Ambulatory Visit: Payer: 59 | Admitting: Adult Health

## 2014-04-17 ENCOUNTER — Encounter: Payer: Self-pay | Admitting: Adult Health

## 2014-04-17 ENCOUNTER — Ambulatory Visit (INDEPENDENT_AMBULATORY_CARE_PROVIDER_SITE_OTHER): Payer: 59 | Admitting: Adult Health

## 2014-04-17 VITALS — BP 112/68 | HR 96 | Temp 98.8°F | Resp 14 | Wt 149.5 lb

## 2014-04-17 DIAGNOSIS — A6000 Herpesviral infection of urogenital system, unspecified: Secondary | ICD-10-CM

## 2014-04-17 DIAGNOSIS — Z113 Encounter for screening for infections with a predominantly sexual mode of transmission: Secondary | ICD-10-CM

## 2014-04-17 DIAGNOSIS — A6002 Herpesviral infection of other male genital organs: Secondary | ICD-10-CM

## 2014-04-17 MED ORDER — ACYCLOVIR 800 MG PO TABS: 800.0000 mg | ORAL_TABLET | Freq: Every day | ORAL | Status: DC

## 2014-04-17 NOTE — Progress Notes (Signed)
Pre visit review using our clinic review tool, if applicable. No additional management support is needed unless otherwise documented below in the visit note. 

## 2014-04-17 NOTE — Patient Instructions (Signed)
  Start Acyclovir 800 mg twice a day for 5 days.  Please have labs drawn prior to leaving the office today.  We will contact you with results once they are available.

## 2014-04-17 NOTE — Progress Notes (Signed)
   Subjective:    Patient ID: Tyler Shannon, male    DOB: [birth year], 23 y.o.   MRN: [id number]  Rash   Pleasant 23 yo AA male presents today for a rash. First noticed the rash on the shaft of his penis ~2 days ago. Describes the rash as mildly itchy and tender to the touch. Has not attempted any treatments and nothing makes it better or worse. Indicates he has had oral sex recently with 2 different partners. Denies any other contacts or sexual intercourse. Has had scabies in the past. Denies any bleeding or discharge.     Past Medical History  Diagnosis Date  . Hypokalemia 05/14/13    Current Outpatient Prescriptions on File Prior to Visit  Medication Sig Dispense Refill  . hydrOXYzine (ATARAX/VISTARIL) 25 MG tablet Take 1 tablet (25 mg total) by mouth 3 (three) times daily as needed. For itching  90 tablet  1  . potassium chloride SA (K-DUR,KLOR-CON) 20 MEQ tablet Take 1 tablet (20 mEq total) by mouth daily.  30 tablet  0   No current facility-administered medications on file prior to visit.     Review of Systems  Skin: Positive for rash (vesicular ).  All other systems reviewed and are negative.      Objective:  BP 112/68  Pulse 96  Temp(Src) 98.8 F (37.1 C) (Oral)  Resp 14  Wt 149 lb 8 oz (67.813 kg)  SpO2 98%   Physical Exam  Vitals reviewed. Constitutional: He appears well-developed and well-nourished. No distress.  Genitourinary:  Vesicular rash noted on left lateral penile shaft.      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 Pt questions were answered and is in agreement with the plan.   - acyclovir (ZOVIRAX) 800 MG tablet; Take 1 tablet (800 mg total) by mouth 2 times daily.  Dispense: 10 tablet; Refill: 0  2. Screen for STD (sexually transmitted disease) Offered tests to rule out other STDs. Pt wished to proceed with testing.    - GC/chlamydia probe amp, urine - RPR - HIV antibody - HSV(herpes simplex vrs) 1+2 ab-IgG  Note: Greater than 30 min spent with pt in face to face communication regarding this problem including treatment, education about prevention of spread, future outbreaks, suppressive therapy vs therapy for acute flare ups. Determining best plan of care of pt taking into consideration frequency of therapy to ascertain compliance with medication was discussed with pt.

## 2014-04-18 DIAGNOSIS — A6002 Herpesviral infection of other male genital organs: Secondary | ICD-10-CM | POA: Insufficient documentation

## 2014-04-18 LAB — HSV(HERPES SIMPLEX VRS) I + II AB-IGG
HSV 1 GLYCOPROTEIN G AB, IGG: 0.18 IV
HSV 2 Glycoprotein G Ab, IgG: 1.29 IV — ABNORMAL HIGH

## 2014-04-18 LAB — HIV 1/2 CONFIRMATION
HIV-1 antibody: POSITIVE — AB
HIV-2 Ab: NEGATIVE

## 2014-04-18 LAB — GC/CHLAMYDIA PROBE AMP, URINE
CHLAMYDIA, SWAB/URINE, PCR: NEGATIVE
GC Probe Amp, Urine: NEGATIVE

## 2014-04-18 LAB — HIV ANTIBODY (ROUTINE TESTING W REFLEX): HIV: REACTIVE — AB

## 2014-04-18 LAB — RPR

## 2014-04-23 ENCOUNTER — Ambulatory Visit: Payer: 59 | Admitting: Adult Health

## 2014-04-24 ENCOUNTER — Ambulatory Visit: Payer: 59 | Admitting: Adult Health

## 2014-04-27 ENCOUNTER — Encounter: Payer: Self-pay | Admitting: Adult Health

## 2014-04-27 ENCOUNTER — Ambulatory Visit (INDEPENDENT_AMBULATORY_CARE_PROVIDER_SITE_OTHER): Payer: 59 | Admitting: Adult Health

## 2014-04-27 VITALS — BP 118/60 | HR 90 | Resp 14 | Ht 69.0 in | Wt 147.5 lb

## 2014-04-27 DIAGNOSIS — B20 Human immunodeficiency virus [HIV] disease: Secondary | ICD-10-CM

## 2014-04-27 NOTE — Patient Instructions (Signed)
  Please have your blood work drawn prior to leaving the office.  I am referring you to an Infectious Disease (ID) Doctor    HIV Infection and AIDS HIV stands for human immunodeficiency virus. HIV is the virus that causes the disease known as AIDS (acquired immunodeficiency syndrome). HIV is a viral infection that attacks the T-cell lymphocytes of the human immune system. If left untreated, HIV will kill enough T-cells so that the body cannot fight off infection. Patients who have AIDS, suffer from "opportunistic infections." Opportunistic infections take advantage of the patient's weak immune system, and cause illness. RISK FACTORS   Direct contact with blood or other body fluids.  Unprotected sexual intercourse.  Sharing of contaminated needles.  Blood transfusions.  Infants whose mothers were infected, during pregnancy or through breast milk. SYMPTOMS   Sometimes, no symptoms.  Flu-like symptoms.  Repeated severe yeast infections in mouth or vagina, despite treatment.  Swollen lymph nodes.  Muscle pain.  Joint pain.  Persistent diarrhea.  Loss of appetite.  Weight loss.  Frequent opportunistic diseases:  Kaposi's sarcoma.  Pneumocystis carinii pneumonia (PCP)  Tuberculosis.  Meningitis.  Herpes simplex infections.  Blurry vision.  Loss of vision. PREVENTION   Know the sexual history of any new sexual partner.  Use safe sex practices, with barrier protection.  Avoid having multiple sexual partners.  Avoid direct contact with blood or other body fluids, by using gloves, goggles, and masks when you might encounter them.  Do not share needles. TREATMENT  HIV and AIDS have no known cure. However, with early diagnosis and proper treatment, one can live a relatively healthy and long life. Treatment is directed at decreasing the level of virus in the body (viral load). To decrease the viral load, patients are given antiviral medicines. Patients are also  given preventive care for many opportunistic diseases, such as pneumonia, tuberculosis, toxoplasmosis, tetanus, hepatitis B, pneumococcal infections, and influenza. Opportunistic infections are also treated as they develop.  Document Released: 10/19/2005 Document Revised: 01/11/2012 Document Reviewed: 01/31/2009 [facility] Patient Information 2015 [employer] [place]. This information is not intended to replace advice given to you by your health care provider. Make sure you discuss any questions you have with your health care provider.

## 2014-04-27 NOTE — Progress Notes (Signed)
Pre visit review using our clinic review tool, if applicable. No additional management support is needed unless otherwise documented below in the visit note. 

## 2014-04-27 NOTE — Progress Notes (Signed)
   Subjective:    Patient ID: Tyler Shannon, male    DOB: [birth year], 23 y.o.   MRN: [id number]  HPI 23 yo pleasant male presents today for f/u of lab results. Mom is present for the interview and pt is okay with Camira Geidel sharing information in front of mom. Pt viewed lab results via MyChart prior to appointment. Pt was recently seen in clinic on 04/17/14 for a vesicular rash on his penis, diagnosed as herpes. At the time, I discussed having further STD testing. Positive for HIV. Selena BattenCody has multiple male sex partners. He reports that he has already notified them to get tested. Selena BattenCody is calm during visit.     Past Medical History  Diagnosis Date  . Hypokalemia 05/14/13    Current Outpatient Prescriptions on File Prior to Visit  Medication Sig Dispense Refill  . acyclovir (ZOVIRAX) 800 MG tablet Take 1 tablet (800 mg total) by mouth 5 (five) times daily.  10 tablet  0  . hydrOXYzine (ATARAX/VISTARIL) 25 MG tablet Take 1 tablet (25 mg total) by mouth 3 (three) times daily as needed. For itching  90 tablet  1  . potassium chloride SA (K-DUR,KLOR-CON) 20 MEQ tablet Take 1 tablet (20 mEq total) by mouth daily.  30 tablet  0   No current facility-administered medications on file prior to visit.     Review of Systems  Psychiatric/Behavioral: Negative for behavioral problems, confusion, sleep disturbance, decreased concentration and agitation. The patient is not nervous/anxious and is not hyperactive.        Objective:  BP 118/60  Pulse 90  Resp 14  Ht 5\' 9"  (1.753 m)  Wt 147 lb 8 oz (66.906 kg)  BMI 21.77 kg/m2  SpO2 98%   Physical Exam  Constitutional: He is oriented to person, place, and time. He appears well-developed and well-nourished. No distress.  HENT:  Head: Normocephalic.  Pulmonary/Chest: Effort normal.  Neurological: He is alert and oriented to person, place, and time.  Skin: Skin is warm and dry.  Psychiatric: He has a normal mood and affect. His behavior is normal.  Judgment and thought content normal.      Assessment & Plan:   1. HIV disease Obtain CD4 count. Refer to infectious disease. Counseled on HIV regarding methods of transmission, viral load, and  and monitoring. Discussed sexual practices, methods of protection, and informing his partners of his positive result. Follow up per ID. Offered to send for counseling but he declined at this time. Allowed time for any questions.   - Ambulatory referral to Infectious Disease - T-helper cells (CD4) count

## 2014-04-30 ENCOUNTER — Encounter: Payer: Self-pay | Admitting: Adult Health

## 2014-04-30 LAB — T-HELPER CELLS (CD4) COUNT (NOT AT ARMC)
Absolute CD4: 572 /uL (ref 381–1469)
CD4 T Helper %: 36 % (ref 32–62)
Total Lymphocyte: 30 % (ref 12–46)
Total lymphocyte count: 1590 /uL (ref 700–3300)
WBC, lymph enumeration: 5.3 10*3/uL (ref 4.0–10.5)

## 2014-05-02 NOTE — Telephone Encounter (Signed)
Mailed unread message to pt  

## 2014-05-15 ENCOUNTER — Ambulatory Visit: Payer: 59

## 2014-05-31 ENCOUNTER — Ambulatory Visit (INDEPENDENT_AMBULATORY_CARE_PROVIDER_SITE_OTHER): Payer: 59

## 2014-05-31 DIAGNOSIS — Z23 Encounter for immunization: Secondary | ICD-10-CM

## 2014-05-31 DIAGNOSIS — B20 Human immunodeficiency virus [HIV] disease: Secondary | ICD-10-CM

## 2014-05-31 DIAGNOSIS — Z113 Encounter for screening for infections with a predominantly sexual mode of transmission: Secondary | ICD-10-CM

## 2014-05-31 DIAGNOSIS — Z79899 Other long term (current) drug therapy: Secondary | ICD-10-CM

## 2014-05-31 LAB — COMPLETE METABOLIC PANEL WITH GFR
ALBUMIN: 4.3 g/dL (ref 3.5–5.2)
ALK PHOS: 69 U/L (ref 39–117)
ALT: 14 U/L (ref 0–53)
AST: 16 U/L (ref 0–37)
BUN: 13 mg/dL (ref 6–23)
CO2: 26 mEq/L (ref 19–32)
Calcium: 9.5 mg/dL (ref 8.4–10.5)
Chloride: 105 mEq/L (ref 96–112)
Creat: 0.89 mg/dL (ref 0.50–1.35)
GFR, Est African American: >89 mL/min
GFR, Est Non African American: >89 mL/min
GLUCOSE: 96 mg/dL (ref 70–99)
POTASSIUM: 4.1 meq/L (ref 3.5–5.3)
SODIUM: 140 meq/L (ref 135–145)
TOTAL PROTEIN: 6.9 g/dL (ref 6.0–8.3)
Total Bilirubin: 0.3 mg/dL (ref 0.2–1.2)

## 2014-05-31 LAB — CBC WITH DIFFERENTIAL/PLATELET
BASOS ABS: 0 10*3/uL (ref 0.0–0.1)
Basophils Relative: 0 % (ref 0–1)
Eosinophils Absolute: 0.2 10*3/uL (ref 0.0–0.7)
Eosinophils Relative: 3 % (ref 0–5)
HCT: 40.4 % (ref 39.0–52.0)
HEMOGLOBIN: 14 g/dL (ref 13.0–17.0)
Lymphocytes Relative: 28 % (ref 12–46)
Lymphs Abs: 1.9 10*3/uL (ref 0.7–4.0)
MCH: 27.9 pg (ref 26.0–34.0)
MCHC: 34.7 g/dL (ref 30.0–36.0)
MCV: 80.5 fL (ref 78.0–100.0)
MONOS PCT: 9 % (ref 3–12)
Monocytes Absolute: 0.6 10*3/uL (ref 0.1–1.0)
NEUTROS ABS: 4.1 10*3/uL (ref 1.7–7.7)
Neutrophils Relative %: 60 % (ref 43–77)
Platelets: 214 10*3/uL (ref 150–400)
RBC: 5.02 MIL/uL (ref 4.22–5.81)
RDW: 14 % (ref 11.5–15.5)
WBC: 6.8 10*3/uL (ref 4.0–10.5)

## 2014-05-31 LAB — LIPID PANEL
Cholesterol: 125 mg/dL (ref 0–200)
HDL: 36 mg/dL — AB (ref >39)
LDL Cholesterol: 61 mg/dL (ref 0–99)
Total CHOL/HDL Ratio: 3.5 Ratio
Triglycerides: 139 mg/dL (ref <150)
VLDL: 28 mg/dL (ref 0–40)

## 2014-05-31 LAB — RPR

## 2014-05-31 NOTE — Progress Notes (Signed)
Patient presents today for new 042 intake, newly diagnosed on 04/17/2014 at [facility] in [place]. He has good family support by his twin sister and Mother. He works at [employer] and has insurance. Patient was pleasant and calm today, but states that he holds a lot in and does not talk much. He complained today of fatigue but no other symptoms. He expressed that he feels like he catches colds quicker now than he used to. Patient will return in 2 weeks to see Dr. Luciana Axeomer. Vaccines given and records received.

## 2014-06-01 LAB — URINALYSIS
Bilirubin Urine: NEGATIVE
Glucose, UA: NEGATIVE mg/dL
Hgb urine dipstick: NEGATIVE
Ketones, ur: NEGATIVE mg/dL
LEUKOCYTES UA: NEGATIVE
Nitrite: NEGATIVE
PROTEIN: NEGATIVE mg/dL
Specific Gravity, Urine: 1.019 (ref 1.005–1.030)
Urobilinogen, UA: 1 mg/dL (ref 0.0–1.0)
pH: 6.5 (ref 5.0–8.0)

## 2014-06-01 LAB — HEPATITIS B SURFACE ANTIGEN: Hepatitis B Surface Ag: NEGATIVE

## 2014-06-01 LAB — HEPATITIS A ANTIBODY, TOTAL: Hep A Total Ab: NONREACTIVE

## 2014-06-01 LAB — HEPATITIS B SURFACE ANTIBODY,QUALITATIVE: Hep B S Ab: NEGATIVE

## 2014-06-01 LAB — T-HELPER CELL (CD4) - (RCID CLINIC ONLY)
CD4 T CELL HELPER: 38 % (ref 33–55)
CD4 T Cell Abs: 750 /uL (ref 400–2700)

## 2014-06-01 LAB — HIV-1 RNA ULTRAQUANT REFLEX TO GENTYP+
HIV 1 RNA QUANT: 19767 {copies}/mL — AB (ref <20)
HIV-1 RNA Quant, Log: 4.3 {Log} — ABNORMAL HIGH (ref <1.30)

## 2014-06-01 LAB — HEPATITIS C ANTIBODY: HCV Ab: NEGATIVE

## 2014-06-01 LAB — HEPATITIS B CORE ANTIBODY, TOTAL: HEP B C TOTAL AB: NONREACTIVE

## 2014-06-07 LAB — HLA B*5701: HLA-B*5701 w/rflx HLA-B High: NEGATIVE

## 2014-06-13 ENCOUNTER — Encounter: Payer: Self-pay | Admitting: *Deleted

## 2014-06-14 LAB — HIV-1 GENOTYPR PLUS

## 2014-06-15 ENCOUNTER — Ambulatory Visit: Payer: 59 | Admitting: Internal Medicine

## 2014-07-17 ENCOUNTER — Ambulatory Visit: Payer: 59 | Admitting: Internal Medicine

## 2014-08-28 ENCOUNTER — Ambulatory Visit (INDEPENDENT_AMBULATORY_CARE_PROVIDER_SITE_OTHER): Payer: 59 | Admitting: Internal Medicine

## 2014-08-28 ENCOUNTER — Encounter: Payer: Self-pay | Admitting: Internal Medicine

## 2014-08-28 VITALS — BP 135/85 | HR 86 | Temp 98.2°F | Ht 69.0 in | Wt 153.0 lb

## 2014-08-28 DIAGNOSIS — Z23 Encounter for immunization: Secondary | ICD-10-CM

## 2014-08-28 DIAGNOSIS — B20 Human immunodeficiency virus [HIV] disease: Secondary | ICD-10-CM

## 2014-08-28 MED ORDER — ELVITEG-COBIC-EMTRICIT-TENOFDF 150-150-200-300 MG PO TABS: 1.0000 | ORAL_TABLET | Freq: Every day | ORAL | Status: AC

## 2014-08-28 NOTE — Assessment & Plan Note (Signed)
Discussed options, disucssed long term benefits, side effects.  Will start Stribild. RTC 4 weeks for labs, then with me after.  Copay card provided.

## 2014-08-28 NOTE — Progress Notes (Signed)
   Subjective:    Patient ID: Tyler Shannon, male    DOB: 25-Jul-1991, 23 y.o.   MRN: [id number]  HPI Here as a new patient with HIV.  Has missed several appointments since his labs in July 2015.  CD4 then of 750, viral load 19,767.  No issues, no weight loss, no diarrhea.  No other medical problems.  Hx of genital herpes, no history of syphilis, GC, chlamydia.  No questions today.  Feels well.  Interested in treatment.     Review of Systems  Constitutional: Negative for fever, fatigue and unexpected weight change.  HENT: Negative for trouble swallowing.   Cardiovascular: Negative for leg swelling.  Gastrointestinal: Negative for nausea and diarrhea.  Musculoskeletal: Negative for arthralgias and myalgias.  Skin: Negative for rash.  Neurological: Negative for dizziness and light-headedness.  Psychiatric/Behavioral: Negative for sleep disturbance.       Objective:   Physical Exam  Constitutional: He appears well-developed and well-nourished. No distress.  HENT:  Mouth/Throat: No oropharyngeal exudate.  Eyes: No scleral icterus.  Cardiovascular: Normal rate and regular rhythm.   Murmur heard. 2/6 SEM  Pulmonary/Chest: Effort normal and breath sounds normal. No respiratory distress.  Abdominal: Soft. Bowel sounds are normal. He exhibits no distension.  Genitourinary: Penis normal.  Musculoskeletal: He exhibits no edema.  Lymphadenopathy:    He has no cervical adenopathy.  Neurological: He is alert.  Skin: Skin is warm and dry. No rash noted.          Assessment & Plan:

## 2014-09-25 ENCOUNTER — Other Ambulatory Visit: Payer: 59

## 2014-09-26 ENCOUNTER — Other Ambulatory Visit: Payer: 59

## 2014-10-08 ENCOUNTER — Ambulatory Visit: Payer: 59 | Admitting: Internal Medicine

## 2014-12-07 ENCOUNTER — Telehealth: Payer: Self-pay

## 2014-12-07 MED ORDER — ACYCLOVIR 800 MG PO TABS: 800.0000 mg | ORAL_TABLET | Freq: Two times a day (BID) | ORAL | Status: DC

## 2014-12-07 NOTE — Telephone Encounter (Signed)
Received a refill request for acyclovir 800 mg. [facility] [employer] patient. History of genital herpes. Is it okay to refill? Please advise.

## 2014-12-07 NOTE — Telephone Encounter (Signed)
Rx sent to pharmacy as ordered. 

## 2014-12-07 NOTE — Telephone Encounter (Signed)
Okay to refill? 

## 2015-03-09 ENCOUNTER — Encounter (HOSPITAL_COMMUNITY): Payer: Self-pay

## 2015-03-09 ENCOUNTER — Emergency Department (HOSPITAL_COMMUNITY)
Admission: EM | Admit: 2015-03-09 | Discharge: 2015-03-09 | Disposition: A | Discharge status: Home / Self Care | Payer: 59 | Source: Home / Self Care | Attending: Family Medicine | Admitting: Family Medicine

## 2015-03-09 ENCOUNTER — Emergency Department (INDEPENDENT_AMBULATORY_CARE_PROVIDER_SITE_OTHER): Payer: 59

## 2015-03-09 DIAGNOSIS — E876 Hypokalemia: Secondary | ICD-10-CM

## 2015-03-09 DIAGNOSIS — B20 Human immunodeficiency virus [HIV] disease: Secondary | ICD-10-CM

## 2015-03-09 DIAGNOSIS — J189 Pneumonia, unspecified organism: Secondary | ICD-10-CM

## 2015-03-09 DIAGNOSIS — R05 Cough: Secondary | ICD-10-CM

## 2015-03-09 DIAGNOSIS — R059 Cough, unspecified: Secondary | ICD-10-CM

## 2015-03-09 LAB — POCT I-STAT, CHEM 8
BUN: 8 mg/dL (ref 6–20)
CREATININE: 1 mg/dL (ref 0.61–1.24)
Calcium, Ion: 1.11 mmol/L — ABNORMAL LOW (ref 1.12–1.23)
Chloride: 101 mmol/L (ref 101–111)
Glucose, Bld: 96 mg/dL (ref 70–99)
HCT: 43 % (ref 39.0–52.0)
HEMOGLOBIN: 14.6 g/dL (ref 13.0–17.0)
POTASSIUM: 3.1 mmol/L — AB (ref 3.5–5.1)
SODIUM: 140 mmol/L (ref 135–145)
TCO2: 22 mmol/L (ref 0–100)

## 2015-03-09 MED ORDER — GUAIFENESIN-CODEINE 100-10 MG/5ML PO SYRP: ORAL_SOLUTION | ORAL | Status: DC

## 2015-03-09 MED ORDER — MOXIFLOXACIN HCL 400 MG PO TABS
400.0000 mg | ORAL_TABLET | Freq: Every day | ORAL | Status: DC
Start: 2015-03-09 — End: 2015-12-03

## 2015-03-09 MED ORDER — POTASSIUM CHLORIDE ER 20 MEQ PO TBCR: EXTENDED_RELEASE_TABLET | ORAL | Status: DC

## 2015-03-09 NOTE — ED Provider Notes (Signed)
CSN: [id number]     Arrival date & time 03/09/15  1703 History   First MD Initiated Contact with Patient 03/09/15 1754     Chief Complaint  Patient presents with  . Cough  . Nausea   (Consider location/radiation/quality/duration/timing/severity/associated sxs/prior Treatment) HPI Comments: 24 year old male complains of a cough for 2 weeks. He has had some occasional gagging. At the outset he had nausea but no vomiting. He complains of decreased appetite and decreased by mouth intake, feeling tired especially after work. He denies having had fever, abdominal pain, GU symptoms. He has HIV is currently taking triple anti retroviral medication.   Past Medical History  Diagnosis Date  . Hypokalemia 05/14/13  . HIV infection    Past Surgical History  Procedure Laterality Date  . Tonsillectomy and adenoidectomy  2007   Family History  Problem Relation Age of Onset  . Hypertension Mother   . Asthma Sister   . Arthritis Maternal Grandmother   . Hypertension Maternal Grandmother   . Diabetes Maternal Grandmother   . Cancer Maternal Grandfather     lung ca   History  Substance Use Topics  . Smoking status: Never Smoker   . Smokeless tobacco: Never Used  . Alcohol Use: Yes     Comment: Occassionally has a drink - beer or mixed drink    Review of Systems  Constitutional: Positive for activity change, appetite change and fatigue.  HENT: Negative.   Eyes: Negative.   Respiratory: Positive for cough. Negative for shortness of breath.   Cardiovascular: Negative.   Gastrointestinal: Positive for nausea. Negative for vomiting, abdominal pain and diarrhea.  Genitourinary: Negative.   Skin: Negative.   Neurological: Negative.     Allergies  Lorabid  Home Medications   Prior to Admission medications   Medication Sig Start Date End Date Taking? Authorizing Provider  elvitegravir-cobicistat-emtricitabine-tenofovir (STRIBILD) 150-150-200-300 MG TABS tablet Take 1 tablet by mouth daily.  Take with food 08/28/14  Yes Gardiner Barefootobert W Comer, MD  acyclovir (ZOVIRAX) 800 MG tablet Take 1 tablet (800 mg total) by mouth 2 (two) times daily. For 5 days 12/07/14   Carollee Leitzarrie M Doss, NP  guaiFENesin-codeine [facility](CHERATUSSIN AC) 100-10 MG/5ML syrup Take 10 ml q 4 to 6 h prn cough 03/09/15   Hayden Rasmussenavid Azhar Yogi, NP  moxifloxacin (AVELOX) 400 MG tablet Take 1 tablet (400 mg total) by mouth daily at 8 pm. 03/09/15   Hayden Rasmussenavid Taige Housman, NP  potassium chloride 20 MEQ TBCR Take 2 tabs daily for 3 days 03/09/15   Hayden Rasmussenavid Cathlene Gardella, NP   BP 139/76 mmHg  Pulse 77  Temp(Src) 99.5 F (37.5 C) (Oral)  Resp 18  SpO2 97% Physical Exam  Constitutional: He is oriented to person, place, and time. He appears well-developed and well-nourished. No distress.  HENT:  Bilateral TMs are normal Oropharynx with erythema and moderate cobblestoning with clear PND.  Eyes: Conjunctivae and EOM are normal.  Neck: Normal range of motion. Neck supple.  Cardiovascular: Normal rate, regular rhythm, normal heart sounds and intact distal pulses.   Pulmonary/Chest: Effort normal and breath sounds normal. No respiratory distress. He has no wheezes. He has no rales.  Musculoskeletal: He exhibits no edema.  Lymphadenopathy:    He has no cervical adenopathy.  Neurological: He is alert and oriented to person, place, and time.  Skin: Skin is warm and dry.  Psychiatric: He has a normal mood and affect.  Nursing note and vitals reviewed.   ED Course  Procedures (including critical care time) Labs Review  Labs Reviewed  POCT I-STAT, CHEM 8 - Abnormal; Notable for the following:    Potassium 3.1 (*)    Calcium, Ion 1.11 (*)    All other components within normal limits    Imaging Review Dg Chest 2 View  03/09/2015   CLINICAL DATA:  Cough for 2 weeks.  HIV.  EXAM: CHEST  2 VIEW  COMPARISON:  None.  FINDINGS: Patchy airspace opacity is present in the lingula. On the lateral view, this projects over the cardiopericardial silhouette. This is less clearly visualized on  the frontal view but appears adjacent to the cardiac apex. No pleural effusion. No plain film evidence of adenopathy.  IMPRESSION: Lingular airspace disease consistent with pneumonia. Followup PA and lateral chest X-ray is recommended in 3-4 weeks following trial of antibiotic therapy to ensure resolution and exclude underlying malignancy.   Electronically Signed   By: Andreas NewportGeoffrey  Lamke M.D.   On: 03/09/2015 18:56     MDM   1. CAP (community acquired pneumonia)   2. Cough   3. HIV disease   4. Hypokalemia due to inadequate potassium intake    Avelox 400 mg daily for 7 days. Note the patient is allergic to a cephalosporin Cheratussin  for cough 1 20 cc Potassium chloride 20 mEq 2 tablets every day for 3 days. Was follow-up with PCP in 2-3 weeks for repeat chest x-ray and follow-up. For any additional symptoms such as fever, shortness of breath worsening cough or no improvement after 3 days of antibiotics sick medical attention promptly. [facility] or may need to go to the emergency department.    Hayden Rasmussenavid Renise Gillies, NP 03/09/15 [phone]

## 2015-03-09 NOTE — ED Notes (Signed)
C/o not felt well x 2 week w cough, weakness, no energy, no appetite

## 2015-03-09 NOTE — Discharge Instructions (Signed)
Cough, Adult ° A cough is a reflex that helps clear your throat and airways. It can help heal the body or may be a reaction to an irritated airway. A cough may only last 2 or 3 weeks (acute) or may last more than 8 weeks (chronic).  °CAUSES °Acute cough: °· Viral or bacterial infections. °Chronic cough: °· Infections. °· Allergies. °· Asthma. °· Post-nasal drip. °· Smoking. °· Heartburn or acid reflux. °· Some medicines. °· Chronic lung problems (COPD). °· Cancer. °SYMPTOMS  °· Cough. °· Fever. °· Chest pain. °· Increased breathing rate. °· High-pitched whistling sound when breathing (wheezing). °· Colored mucus that you cough up (sputum). °TREATMENT  °· A bacterial cough may be treated with antibiotic medicine. °· A viral cough must run its course and will not respond to antibiotics. °· Your caregiver may recommend other treatments if you have a chronic cough. °HOME CARE INSTRUCTIONS  °· Only take over-the-counter or prescription medicines for pain, discomfort, or fever as directed by your caregiver. Use cough suppressants only as directed by your caregiver. °· Use a cold steam vaporizer or humidifier in your bedroom or home to help loosen secretions. °· Sleep in a semi-upright position if your cough is worse at night. °· Rest as needed. °· Stop smoking if you smoke. °SEEK IMMEDIATE MEDICAL CARE IF:  °· You have pus in your sputum. °· Your cough starts to worsen. °· You cannot control your cough with suppressants and are losing sleep. °· You begin coughing up blood. °· You have difficulty breathing. °· You develop pain which is getting worse or is uncontrolled with medicine. °· You have a fever. °MAKE SURE YOU:  °· Understand these instructions. °· Will watch your condition. °· Will get help right away if you are not doing well or get worse. °Document Released: 04/17/2011 Document Revised: 01/11/2012 Document Reviewed: 04/17/2011 °Exit[employer]® Patient Information ©2015 [employer]. This information is not intended  to replace advice given to you by your health care provider. Make sure you discuss any questions you have with your health care provider. °Pneumonia °Pneumonia is an infection of the lungs.  °CAUSES °Pneumonia may be caused by bacteria or a virus. Usually, these infections are caused by breathing infectious particles into the lungs (respiratory tract). °SIGNS AND SYMPTOMS  °· Cough. °· Fever. °· Chest pain. °· Increased rate of breathing. °· Wheezing. °· Mucus production. °DIAGNOSIS  °If you have the common symptoms of pneumonia, your health care provider will typically confirm the diagnosis with a chest X-ray. The X-ray will show an abnormality in the lung (pulmonary infiltrate) if you have pneumonia. Other tests of your blood, urine, or sputum may be done to find the specific cause of your pneumonia. Your health care provider may also do tests (blood gases or pulse oximetry) to see how well your lungs are working. °TREATMENT  °Some forms of pneumonia may be spread to other people when you cough or sneeze. You may be asked to wear a mask before and during your exam. Pneumonia that is caused by bacteria is treated with antibiotic medicine. Pneumonia that is caused by the influenza virus may be treated with an antiviral medicine. Most other viral infections must run their course. These infections will not respond to antibiotics.  °HOME CARE INSTRUCTIONS  °· Cough suppressants may be used if you are losing too much rest. However, coughing protects you by clearing your lungs. You should avoid using cough suppressants if you can. °· Your health care provider may have   prescribed medicine if he or she thinks your pneumonia is caused by bacteria or influenza. Finish your medicine even if you start to feel better. °· Your health care provider may also prescribe an expectorant. This loosens the mucus to be coughed up. °· Take medicines only as directed by your health care provider. °· Do not smoke. Smoking is a common cause  of bronchitis and can contribute to pneumonia. If you are a smoker and continue to smoke, your cough may last several weeks after your pneumonia has cleared. °· A cold steam vaporizer or humidifier in your room or home may help loosen mucus. °· Coughing is often worse at night. Sleeping in a semi-upright position in a recliner or using a couple pillows under your head will help with this. °· Get rest as you feel it is needed. Your body will usually let you know when you need to rest. °PREVENTION °A pneumococcal shot (vaccine) is available to prevent a common bacterial cause of pneumonia. This is usually suggested for: °· People over 65 years old. °· Patients on chemotherapy. °· People with chronic lung problems, such as bronchitis or emphysema. °· People with immune system problems. °If you are over 65 or have a high risk condition, you may receive the pneumococcal vaccine if you have not received it before. In some countries, a routine influenza vaccine is also recommended. This vaccine can help prevent some cases of pneumonia. You may be offered the influenza vaccine as part of your care. °If you smoke, it is time to quit. You may receive instructions on how to stop smoking. Your health care provider can provide medicines and counseling to help you quit. °SEEK MEDICAL CARE IF: °You have a fever. °SEEK IMMEDIATE MEDICAL CARE IF:  °· Your illness becomes worse. This is especially true if you are elderly or weakened from any other disease. °· You cannot control your cough with suppressants and are losing sleep. °· You begin coughing up blood. °· You develop pain which is getting worse or is uncontrolled with medicines. °· Any of the symptoms which initially brought you in for treatment are getting worse rather than better. °· You develop shortness of breath or chest pain. °MAKE SURE YOU:  °· Understand these instructions. °· Will watch your condition. °· Will get help right away if you are not doing well or get  worse. °Document Released: 10/19/2005 Document Revised: 03/05/2014 Document Reviewed: 01/08/2011 °ExitCare® Patient Information ©2015 [employer]. This information is not intended to replace advice given to you by your health care provider. Make sure you discuss any questions you have with your health care provider. ° °

## 2015-07-28 ENCOUNTER — Encounter: Payer: Self-pay | Admitting: Emergency Medicine

## 2015-07-28 ENCOUNTER — Emergency Department
Admission: EM | Admit: 2015-07-28 | Discharge: 2015-07-28 | Disposition: A | Discharge status: Home / Self Care | Payer: 59 | Attending: Student | Admitting: Student

## 2015-07-28 DIAGNOSIS — Z792 Long term (current) use of antibiotics: Secondary | ICD-10-CM | POA: Insufficient documentation

## 2015-07-28 DIAGNOSIS — L509 Urticaria, unspecified: Secondary | ICD-10-CM

## 2015-07-28 DIAGNOSIS — D849 Immunodeficiency, unspecified: Secondary | ICD-10-CM | POA: Insufficient documentation

## 2015-07-28 DIAGNOSIS — Z79899 Other long term (current) drug therapy: Secondary | ICD-10-CM | POA: Insufficient documentation

## 2015-07-28 DIAGNOSIS — R21 Rash and other nonspecific skin eruption: Secondary | ICD-10-CM | POA: Diagnosis present

## 2015-07-28 MED ORDER — RANITIDINE HCL 150 MG PO TABS: 150.0000 mg | ORAL_TABLET | Freq: Two times a day (BID) | ORAL | Status: DC

## 2015-07-28 MED ORDER — DIPHENHYDRAMINE HCL 50 MG PO CAPS
50.0000 mg | ORAL_CAPSULE | Freq: Once | ORAL | Status: AC
  Administered 2015-07-28: 50 mg via ORAL
  Filled 2015-07-28: qty 1

## 2015-07-28 MED ORDER — FAMOTIDINE 20 MG PO TABS
20.0000 mg | ORAL_TABLET | Freq: Once | ORAL | Status: AC
  Administered 2015-07-28: 20 mg via ORAL
  Filled 2015-07-28: qty 1

## 2015-07-28 NOTE — ED Provider Notes (Signed)
[facility] Emergency Department Provider Note  ____________________________________________  Time seen: Approximately 1:25 PM  I have reviewed the triage vital signs and the nursing notes.   HISTORY  Chief Complaint Allergic Reaction   HPI Tyler Shannon is a 24 y.o. male is here with complaint of rash for 1-2 days. Patient states that it itches and that the rash comes and goes. He denies any respiratory difficulty or swallowing. He states that several weeks ago he had the same kind of rash that eventually went away. He did not seek any medical attention at that time. He states his medication has not changed. He is unaware of any allergies. He denies any history of asthma as a child. He has not taken any over-the-counter medication for his rash. Currently he describes the itching as a 8 or 9 out of 10.   Past Medical History  Diagnosis Date  . Hypokalemia 05/14/13  . HIV infection     Patient Active Problem List   Diagnosis Date Noted  . HIV disease 04/27/2014  . Herpes genitalis in men 04/18/2014  . Penile ulcer 12/09/2013  . 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 Scabies infestation 11/03/2013  . Loss of weight 08/16/2013  . Screen for STD (sexually transmitted disease) 08/14/2013  . Routine general medical examination at a health care facility 05/30/2013    Past Surgical History  Procedure Laterality Date  . Tonsillectomy and adenoidectomy  2007    Current Outpatient Rx  Name  Route  Sig  Dispense  Refill  . acyclovir (ZOVIRAX) 800 MG tablet   Oral   Take 1 tablet (800 mg total) by mouth 2 (two) times daily. For 5 days   10 tablet   0   . elvitegravir-cobicistat-emtricitabine-tenofovir (STRIBILD) 150-150-200-300 MG TABS tablet   Oral   Take 1 tablet by mouth daily. Take with food   30 tablet   5   . guaiFENesin-codeine (CHERATUSSIN AC) 100-10 MG/5ML syrup      Take 10 ml q 4 to 6 h prn cough   120 mL   0   . moxifloxacin (AVELOX) 400  MG tablet   Oral   Take 1 tablet (400 mg total) by mouth daily at 8 pm.   7 tablet   0   . potassium chloride 20 MEQ TBCR      Take 2 tabs daily for 3 days   6 tablet   0   . ranitidine (ZANTAC) 150 MG tablet   Oral   Take 1 tablet (150 mg total) by mouth 2 (two) times daily.   60 tablet   1     Allergies Lorabid  Family History  Problem Relation Age of Onset  . Hypertension Mother   . Asthma Sister   . Arthritis Maternal Grandmother   . Hypertension Maternal Grandmother   . Diabetes Maternal Grandmother   . Cancer Maternal Grandfather     lung ca    Social History Social History  Substance Use Topics  . Smoking status: Never Smoker   . Smokeless tobacco: Never Used  . Alcohol Use: Yes     Comment: Occassionally has a drink - beer or mixed drink    Review of Systems Constitutional: No fever/chills Eyes: No visual changes. ENT: No sore throat. Cardiovascular: Denies chest pain. Respiratory: Denies shortness of breath. Gastrointestinal:.  No nausea, no vomiting.  Genitourinary: Negative for dysuria. Musculoskeletal: Negative for back pain. Skin: Positive for rash. Neurological: Negative for headaches, focal weakness or  numbness. Allergic/Immunilogical: Immune compromised 10-point ROS otherwise negative.  ____________________________________________   PHYSICAL EXAM:  VITAL SIGNS: ED Triage Vitals  Enc Vitals Group     BP --      Pulse --      Resp --      Temp --      Temp src --      SpO2 --      Weight --      Height --      Head Cir --      Peak Flow --      Pain Score --      Pain Loc --      Pain Edu? --      Excl. in GC? --     Constitutional: Alert and oriented. Well appearing and in no acute distress. Eyes: Conjunctivae are normal. PERRL. EOMI. Head: Atraumatic. Nose: No congestion/rhinnorhea. Mouth/Throat: Mucous membranes are moist.  Oropharynx non-erythematous. Neck: No stridor.   Cardiovascular: Normal rate, regular  rhythm. Grossly normal heart sounds.  Good peripheral circulation. Respiratory: Normal respiratory effort.  No retractions. Lungs CTAB. Gastrointestinal: Soft and nontender. No distention. Musculoskeletal: His extremities without any difficulty. No lower extremity tenderness nor edema.  No joint effusions. Neurologic:  Normal speech and language. No gross focal neurologic deficits are appreciated. No gait instability. Skin:  Skin is warm, dry and intact. No rash noted. There is a diffuse erythematous irregular rash. There is some places over the extremities and torso. Psychiatric: Mood and affect are normal. Speech and behavior are normal.  ____________________________________________   LABS (all labs ordered are listed, but only abnormal results are displayed)  Labs Reviewed - No data to display   PROCEDURES  Procedure(s) performed: None  Critical Care performed: No  ____________________________________________   INITIAL IMPRESSION / ASSESSMENT AND PLAN / ED COURSE  Pertinent labs & imaging results that were available during my care of the patient were reviewed by me and considered in my medical decision making (see chart for details).  Patient will follow-up with his PCP Monday if there is no improvement. He was given Pepcid and Benadryl while in the emergency room with some relief. He is to continue with Zantac and Benadryl at home. At this time we deferred prednisone use. He is to return to the emergency room if any severe worsening of his symptoms. ____________________________________________   FINAL CLINICAL IMPRESSION(S) / ED DIAGNOSES  Final diagnoses:  Hives of unknown origin      Tommi Rumps, PA-C 07/28/15 1803  Gayla Doss, MD 07/29/15 [phone]

## 2015-07-28 NOTE — Discharge Instructions (Signed)
Follow-up with your doctor if any continued problems. Benadryl over-the-counter 1-2 capsules every 6 hours as needed for itching. Zantac 150 mg 1 twice a day Return to the emergency room if any severe worsening or urgent concerns.

## 2015-07-28 NOTE — ED Notes (Signed)
Developed rash 1-2 days ago possible allergic reaction.

## 2015-08-09 ENCOUNTER — Telehealth: Payer: Self-pay | Admitting: Nurse Practitioner

## 2015-08-09 ENCOUNTER — Ambulatory Visit: Payer: 59 | Admitting: Nurse Practitioner

## 2015-08-09 DIAGNOSIS — Z0289 Encounter for other administrative examinations: Secondary | ICD-10-CM

## 2015-08-09 NOTE — Telephone Encounter (Signed)
FYI, Pt called stating that he is not able to get off of work today for his appt. Appt is still on the schedule. Pt did reschedule appointment. Thank You!

## 2015-08-09 NOTE — Telephone Encounter (Signed)
Ok appt was rescheduled. :)

## 2015-08-09 NOTE — Telephone Encounter (Signed)
Okay to reschedule and will not charge no-show fee.

## 2015-08-13 ENCOUNTER — Ambulatory Visit: Payer: 59 | Admitting: Nurse Practitioner

## 2015-08-13 DIAGNOSIS — Z0289 Encounter for other administrative examinations: Secondary | ICD-10-CM

## 2015-10-15 ENCOUNTER — Ambulatory Visit: Payer: 59 | Admitting: Internal Medicine

## 2015-11-21 ENCOUNTER — Emergency Department
Admission: EM | Admit: 2015-11-21 | Discharge: 2015-11-22 | Disposition: A | Discharge status: Home / Self Care | Payer: 59 | Attending: Emergency Medicine | Admitting: Emergency Medicine

## 2015-11-21 ENCOUNTER — Emergency Department: Payer: 59

## 2015-11-21 DIAGNOSIS — Z792 Long term (current) use of antibiotics: Secondary | ICD-10-CM | POA: Insufficient documentation

## 2015-11-21 DIAGNOSIS — Z79899 Other long term (current) drug therapy: Secondary | ICD-10-CM | POA: Insufficient documentation

## 2015-11-21 DIAGNOSIS — R0602 Shortness of breath: Secondary | ICD-10-CM | POA: Diagnosis not present

## 2015-11-21 DIAGNOSIS — R05 Cough: Secondary | ICD-10-CM | POA: Diagnosis present

## 2015-11-21 DIAGNOSIS — J209 Acute bronchitis, unspecified: Secondary | ICD-10-CM | POA: Insufficient documentation

## 2015-11-21 LAB — COMPREHENSIVE METABOLIC PANEL
ALK PHOS: 72 U/L (ref 38–126)
ALT: 39 U/L (ref 17–63)
AST: 26 U/L (ref 15–41)
Albumin: 4.3 g/dL (ref 3.5–5.0)
Anion gap: 7 (ref 5–15)
BUN: 11 mg/dL (ref 6–20)
CALCIUM: 9.3 mg/dL (ref 8.9–10.3)
CO2: 28 mmol/L (ref 22–32)
Chloride: 106 mmol/L (ref 101–111)
Creatinine, Ser: 0.85 mg/dL (ref 0.61–1.24)
GFR calc non Af Amer: >60 mL/min (ref >60)
Glucose, Bld: 105 mg/dL — ABNORMAL HIGH (ref 65–99)
Potassium: 3.4 mmol/L — ABNORMAL LOW (ref 3.5–5.1)
SODIUM: 141 mmol/L (ref 135–145)
Total Bilirubin: 1 mg/dL (ref 0.3–1.2)
Total Protein: 7.8 g/dL (ref 6.5–8.1)

## 2015-11-21 LAB — CBC
HEMATOCRIT: 41.7 % (ref 40.0–52.0)
HEMOGLOBIN: 14.5 g/dL (ref 13.0–18.0)
MCH: 28.4 pg (ref 26.0–34.0)
MCHC: 34.8 g/dL (ref 32.0–36.0)
MCV: 81.6 fL (ref 80.0–100.0)
PLATELETS: 175 10*3/uL (ref 150–440)
RBC: 5.11 MIL/uL (ref 4.40–5.90)
RDW: 13.1 % (ref 11.5–14.5)
WBC: 8.5 10*3/uL (ref 3.8–10.6)

## 2015-11-21 LAB — TROPONIN I: Troponin I: <0.03 ng/mL (ref <0.031)

## 2015-11-21 NOTE — ED Notes (Addendum)
Pt arrived to ED with c/o cough and difficulty breathing and pain in chest with taking deep breaths.  Pt states "I feel like I have Pneumonia". Pt reports PMH of PNA x 1 year ago.

## 2015-11-22 DIAGNOSIS — J209 Acute bronchitis, unspecified: Secondary | ICD-10-CM | POA: Diagnosis not present

## 2015-11-22 DIAGNOSIS — Z79899 Other long term (current) drug therapy: Secondary | ICD-10-CM | POA: Diagnosis not present

## 2015-11-22 DIAGNOSIS — Z792 Long term (current) use of antibiotics: Secondary | ICD-10-CM | POA: Diagnosis not present

## 2015-11-22 LAB — LACTATE DEHYDROGENASE: LDH: 133 U/L (ref 98–192)

## 2015-11-22 MED ORDER — SULFAMETHOXAZOLE-TRIMETHOPRIM 800-160 MG PO TABS: 1.0000 | ORAL_TABLET | Freq: Two times a day (BID) | ORAL | Status: AC

## 2015-11-22 MED ORDER — ACETAMINOPHEN-CODEINE #2 300-15 MG PO TABS: 1.0000 | ORAL_TABLET | ORAL | Status: DC | PRN

## 2015-11-22 MED ORDER — ACETAMINOPHEN-CODEINE #3 300-30 MG PO TABS
ORAL_TABLET | ORAL | Status: AC
  Filled 2015-11-22: qty 1

## 2015-11-22 MED ORDER — ACETAMINOPHEN-CODEINE #3 300-30 MG PO TABS
1.0000 | ORAL_TABLET | Freq: Once | ORAL | Status: AC
  Administered 2015-11-22: 1 via ORAL

## 2015-11-22 MED ORDER — SULFAMETHOXAZOLE-TRIMETHOPRIM 800-160 MG PO TABS
1.0000 | ORAL_TABLET | Freq: Once | ORAL | Status: AC
  Administered 2015-11-22: 1 via ORAL
  Filled 2015-11-22: qty 1

## 2015-11-22 NOTE — ED Notes (Signed)
Pt ambulatory sat remained 99-100% entire time. Pt denies SOB of dyspnea. No acute distress noted. MD Manson Passey made aware, verbalized understanding, no further orders given at this time

## 2015-11-22 NOTE — ED Provider Notes (Signed)
[facility] Emergency Department Provider Note  ____________________________________________  Time seen: 12:06AM  I have reviewed the triage vital signs and the nursing notes.   HISTORY  Chief Complaint Cough     HPI Tyler Shannon is a 25 y.o. male presents with cough and dyspnea accompanied by chest discomfort with deep inspiration times one week. Patient states "I feel like I have pneumonia." Patient states he had pneumonia a year ago patient admits to chills and subjective fevers at home afebrile here on presentation emergency department a temperature 98.2. Of note patient has a history of HIV but is unsure of his CD4 count however he is taking antiviral medication.     Past Medical History  Diagnosis Date  . Hypokalemia 05/14/13  . HIV infection [facility])     Patient Active Problem List   Diagnosis Date Noted  . HIV disease (HCC) 04/27/2014  . Herpes genitalis in men 04/18/2014  . Penile ulcer 12/09/2013  . 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 Scabies infestation 11/03/2013  . Loss of weight 08/16/2013  . Screen for STD (sexually transmitted disease) 08/14/2013  . Routine general medical examination at a health care facility 05/30/2013    Past Surgical History  Procedure Laterality Date  . Tonsillectomy and adenoidectomy  2007    Current Outpatient Rx  Name  Route  Sig  Dispense  Refill  . acyclovir (ZOVIRAX) 800 MG tablet   Oral   Take 1 tablet (800 mg total) by mouth 2 (two) times daily. For 5 days   10 tablet   0   . elvitegravir-cobicistat-emtricitabine-tenofovir (STRIBILD) 150-150-200-300 MG TABS tablet   Oral   Take 1 tablet by mouth daily. Take with food   30 tablet   5   . guaiFENesin-codeine (CHERATUSSIN AC) 100-10 MG/5ML syrup      Take 10 ml q 4 to 6 h prn cough   120 mL   0   . moxifloxacin (AVELOX) 400 MG tablet   Oral   Take 1 tablet (400 mg total) by mouth daily at 8 pm.   7 tablet   0   . potassium chloride 20  MEQ TBCR      Take 2 tabs daily for 3 days   6 tablet   0   . ranitidine (ZANTAC) 150 MG tablet   Oral   Take 1 tablet (150 mg total) by mouth 2 (two) times daily.   60 tablet   1     Allergies Lorabid  Family History  Problem Relation Age of Onset  . Hypertension Mother   . Asthma Sister   . Arthritis Maternal Grandmother   . Hypertension Maternal Grandmother   . Diabetes Maternal Grandmother   . Cancer Maternal Grandfather     lung ca    Social History Social History  Substance Use Topics  . Smoking status: Never Smoker   . Smokeless tobacco: Never Used  . Alcohol Use: Yes     Comment: Occassionally has a drink - beer or mixed drink    Review of Systems  Constitutional: Negative for fever. Eyes: Negative for visual changes. ENT: Negative for sore throat. Cardiovascular: Negative for chest pain. Respiratory: Negative for shortness of breath. Gastrointestinal: Negative for abdominal pain, vomiting and diarrhea. Genitourinary: Negative for dysuria. Musculoskeletal: Negative for back pain. Skin: Negative for rash. Neurological: Negative for headaches, focal weakness or numbness.   10-point ROS otherwise negative.  ____________________________________________   PHYSICAL EXAM:  VITAL SIGNS: ED Triage Vitals  Enc Vitals Group     BP 11/21/15 2224 136/70 mmHg     Pulse Rate 11/21/15 2224 92     Resp 11/21/15 2224 17     Temp 11/21/15 2224 98.2 F (36.8 C)     Temp Source 11/21/15 2224 Oral     SpO2 11/21/15 2224 99 %     Weight 11/21/15 2224 140 lb (63.504 kg)     Height 11/21/15 2224  (1.727 m)     Head Cir --      Peak Flow --      Pain Score 11/21/15 2226 7     Pain Loc --      Pain Edu? --      Excl. in GC? --      Constitutional: Alert and oriented. Well appearing and in no distress. Eyes: Conjunctivae are normal. PERRL. Normal extraocular movements. ENT   Head: Normocephalic and atraumatic.   Nose: No  congestion/rhinnorhea.   Mouth/Throat: Mucous membranes are moist.   Neck: No stridor. Hematological/Lymphatic/Immunilogical: No cervical lymphadenopathy. Cardiovascular: Normal rate, regular rhythm. Normal and symmetric distal pulses are present in all extremities. No murmurs, rubs, or gallops. Respiratory: Normal respiratory effort without tachypnea nor retractions. Breath sounds are clear and equal bilaterally. No wheezes/rales/rhonchi. Gastrointestinal: Soft and nontender. No distention. There is no CVA tenderness. Genitourinary: deferred Musculoskeletal: Nontender with normal range of motion in all extremities. No joint effusions.  No lower extremity tenderness nor edema. Neurologic:  Normal speech and language. No gross focal neurologic deficits are appreciated. Speech is normal.  Skin:  Skin is warm, dry and intact. No rash noted. Psychiatric: Mood and affect are normal. Speech and behavior are normal. Patient exhibits appropriate insight and judgment.  ____________________________________________    LABS (pertinent positives/negatives)  Labs Reviewed  COMPREHENSIVE METABOLIC PANEL - Abnormal; Notable for the following:    Potassium 3.4 (*)    Glucose, Bld 105 (*)    All other components within normal limits  CBC  TROPONIN I  LACTATE DEHYDROGENASE       RADIOLOGY  DG Chest 2 View (Final result) Result time: 11/21/15 22:46:17   Final result by Rad Results In Interface (11/21/15 22:46:17)   Narrative:   CLINICAL DATA: 25 year old male with difficulty breathing  EXAM: CHEST 2 VIEW  COMPARISON: None.  FINDINGS: The heart size and mediastinal contours are within normal limits. Both lungs are clear. The visualized skeletal structures are unremarkable.  IMPRESSION: No active cardiopulmonary disease.      INITIAL IMPRESSION / ASSESSMENT AND PLAN / ED COURSE  Pertinent labs & imaging results that were available during my care of the patient were  reviewed by me and considered in my medical decision making (see chart for details).  Patient's mother states that the patient has had recurrent episodes of coughing congestion and difficulty breathing and chills over course of months  ____________________________________________   FINAL CLINICAL IMPRESSION(S) / ED DIAGNOSES  Final diagnoses:  Acute bronchitis, unspecified organism      Darci Current, MD 11/22/15 [phone]

## 2015-11-22 NOTE — ED Notes (Signed)
MD Brown at bedside.

## 2015-11-22 NOTE — Discharge Instructions (Signed)

## 2015-11-22 NOTE — ED Notes (Addendum)
Lab called regarding add on lactate dehydrogenase, will add on at this time

## 2015-11-22 NOTE — ED Notes (Signed)
Pt requesting tylenol with codeine at this time, MD brown made aware SEE MAR, will d/c once meds given

## 2015-12-01 ENCOUNTER — Emergency Department
Admission: EM | Admit: 2015-12-01 | Discharge: 2015-12-01 | Disposition: A | Discharge status: Home / Self Care | Payer: 59 | Attending: Emergency Medicine | Admitting: Emergency Medicine

## 2015-12-01 ENCOUNTER — Encounter: Payer: Self-pay | Admitting: Emergency Medicine

## 2015-12-01 DIAGNOSIS — L299 Pruritus, unspecified: Secondary | ICD-10-CM | POA: Diagnosis not present

## 2015-12-01 DIAGNOSIS — Z79899 Other long term (current) drug therapy: Secondary | ICD-10-CM | POA: Insufficient documentation

## 2015-12-01 DIAGNOSIS — Z792 Long term (current) use of antibiotics: Secondary | ICD-10-CM | POA: Insufficient documentation

## 2015-12-01 DIAGNOSIS — R21 Rash and other nonspecific skin eruption: Secondary | ICD-10-CM | POA: Insufficient documentation

## 2015-12-01 DIAGNOSIS — L988 Other specified disorders of the skin and subcutaneous tissue: Secondary | ICD-10-CM | POA: Diagnosis not present

## 2015-12-01 DIAGNOSIS — Z7952 Long term (current) use of systemic steroids: Secondary | ICD-10-CM | POA: Diagnosis not present

## 2015-12-01 MED ORDER — METHYLPREDNISOLONE 4 MG PO TBPK: ORAL_TABLET | ORAL | Status: DC

## 2015-12-01 MED ORDER — DESOXIMETASONE 0.25 % EX CREA: 1.0000 "application " | TOPICAL_CREAM | Freq: Two times a day (BID) | CUTANEOUS | Status: DC

## 2015-12-01 MED ORDER — HYDROXYZINE HCL 50 MG PO TABS: 50.0000 mg | ORAL_TABLET | Freq: Three times a day (TID) | ORAL | Status: DC | PRN

## 2015-12-01 MED ORDER — DEXAMETHASONE SODIUM PHOSPHATE 10 MG/ML IJ SOLN
10.0000 mg | Freq: Once | INTRAMUSCULAR | Status: AC
  Administered 2015-12-01: 10 mg via INTRAMUSCULAR
  Filled 2015-12-01: qty 1

## 2015-12-01 MED ORDER — HYDROXYZINE HCL 50 MG PO TABS
50.0000 mg | ORAL_TABLET | Freq: Once | ORAL | Status: AC
  Administered 2015-12-01: 50 mg via ORAL
  Filled 2015-12-01: qty 1

## 2015-12-01 NOTE — ED Provider Notes (Signed)
[facility] Emergency Department Provider Note  ____________________________________________  Time seen: Approximately 11:02 PM  I have reviewed the triage vital signs and the nursing notes.   HISTORY  Chief Complaint Rash    HPI Tyler Shannon is a 25 y.o. male patient complain of diffuse rash for 4 days. Patient stated intermittent itching but denies increased nocturnal itching. Patient state transient relief from itching by taking Benadryl. Patient state the rash persists. Patient denies any fever or chills associated with this complaint. Patient denies pain. No other palliative measures taken for this complaint.   Past Medical History  Diagnosis Date  . Hypokalemia 05/14/13  . HIV infection [facility])     Patient Active Problem List   Diagnosis Date Noted  . HIV disease (HCC) 04/27/2014  . Herpes genitalis in men 04/18/2014  . Penile ulcer 12/09/2013  . 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 Scabies infestation 11/03/2013  . Loss of weight 08/16/2013  . Screen for STD (sexually transmitted disease) 08/14/2013  . Routine general medical examination at a health care facility 05/30/2013    Past Surgical History  Procedure Laterality Date  . Tonsillectomy and adenoidectomy  2007    Current Outpatient Rx  Name  Route  Sig  Dispense  Refill  . acetaminophen-codeine (TYLENOL #2) 300-15 MG tablet   Oral   Take 1 tablet by mouth every 4 (four) hours as needed for moderate pain.   20 tablet   0   . acyclovir (ZOVIRAX) 800 MG tablet   Oral   Take 1 tablet (800 mg total) by mouth 2 (two) times daily. For 5 days   10 tablet   0   . desoximetasone (TOPICORT) 0.25 % cream   Topical   Apply 1 application topically 2 (two) times daily.   30 g   0   . elvitegravir-cobicistat-emtricitabine-tenofovir (STRIBILD) 150-150-200-300 MG TABS tablet   Oral   Take 1 tablet by mouth daily. Take with food   30 tablet   5   . guaiFENesin-codeine (CHERATUSSIN AC)  100-10 MG/5ML syrup      Take 10 ml q 4 to 6 h prn cough   120 mL   0   . hydrOXYzine (ATARAX/VISTARIL) 50 MG tablet   Oral   Take 1 tablet (50 mg total) by mouth 3 (three) times daily as needed.   30 tablet   0   . methylPREDNISolone (MEDROL DOSEPAK) 4 MG TBPK tablet      Take Tapered dose as directed   21 tablet   0   . moxifloxacin (AVELOX) 400 MG tablet   Oral   Take 1 tablet (400 mg total) by mouth daily at 8 pm.   7 tablet   0   . potassium chloride 20 MEQ TBCR      Take 2 tabs daily for 3 days   6 tablet   0   . ranitidine (ZANTAC) 150 MG tablet   Oral   Take 1 tablet (150 mg total) by mouth 2 (two) times daily.   60 tablet   1   . sulfamethoxazole-trimethoprim (BACTRIM DS,SEPTRA DS) 800-160 MG tablet   Oral   Take 1 tablet by mouth 2 (two) times daily.   20 tablet   0     Allergies Lorabid  Family History  Problem Relation Age of Onset  . Hypertension Mother   . Asthma Sister   . Arthritis Maternal Grandmother   . Hypertension Maternal Grandmother   . Diabetes Maternal  Grandmother   . Cancer Maternal Grandfather     lung ca    Social History Social History  Substance Use Topics  . Smoking status: Never Smoker   . Smokeless tobacco: Never Used  . Alcohol Use: Yes     Comment: Occassionally has a drink - beer or mixed drink    Review of Systems Constitutional: No fever/chills Eyes: No visual changes. ENT: No sore throat. Cardiovascular: Denies chest pain. Respiratory: Denies shortness of breath. Gastrointestinal: No abdominal pain.  No nausea, no vomiting.  No diarrhea.  No constipation. Genitourinary: Negative for dysuria. Musculoskeletal: Negative for back pain. Skin: Positive for rash. Neurological: Negative for headaches, focal weakness or numbness. Allergic/Immunilogical: Lorabid. 10-point ROS otherwise negative.  ____________________________________________   PHYSICAL EXAM:  VITAL SIGNS: ED Triage Vitals  Enc Vitals  Group     BP 12/01/15 2243 128/83 mmHg     Pulse Rate 12/01/15 2243 92     Resp --      Temp 12/01/15 2243 98.2 F (36.8 C)     Temp Source 12/01/15 2243 Oral     SpO2 12/01/15 2243 98 %     Weight 12/01/15 2243 152 lb (68.947 kg)     Height 12/01/15 2243  (1.727 m)     Head Cir --      Peak Flow --      Pain Score 12/01/15 2254 0     Pain Loc --      Pain Edu? --      Excl. in GC? --     Constitutional: Alert and oriented. Well appearing and in no acute distress. Eyes: Conjunctivae are normal. PERRL. EOMI. Head: Atraumatic. Nose: No congestion/rhinnorhea. Mouth/Throat: Mucous membranes are moist.  Oropharynx non-erythematous. Neck: No stridor.  No cervical spine tenderness to palpation. Hematological/Lymphatic/Immunilogical: No cervical lymphadenopathy. Cardiovascular: Normal rate, regular rhythm. Grossly normal heart sounds.  Good peripheral circulation. Respiratory: Normal respiratory effort.  No retractions. Lungs CTAB. Gastrointestinal: Soft and nontender. No distention. No abdominal bruits. No CVA tenderness. Musculoskeletal: No lower extremity tenderness nor edema.  No joint effusions. Neurologic:  Normal speech and language. No gross focal neurologic deficits are appreciated. No gait instability. Skin:  Skin is warm, dry and intact. Diffuse papular lesions on non-erythematous base. No signs of secondary infection. Psychiatric: Mood and affect are normal. Speech and behavior are normal.  ____________________________________________   LABS (all labs ordered are listed, but only abnormal results are displayed)  Labs Reviewed - No data to display ____________________________________________  EKG   ____________________________________________  RADIOLOGY   ____________________________________________   PROCEDURES  Procedure(s) performed: None  Critical Care performed: No  ____________________________________________   INITIAL IMPRESSION / ASSESSMENT  AND PLAN / ED COURSE  Pertinent labs & imaging results that were available during my care of the patient were reviewed by me and considered in my medical decision making (see chart for details).  Rash. Patient given discharge care instructions. Patient had prescriptions for Medrol Dosepak and Atarax. Patient advised to follow-up family doctor nose no improvement in next 2-3 days. ____________________________________________   FINAL CLINICAL IMPRESSION(S) / ED DIAGNOSES  Final diagnoses:  Rash and nonspecific skin eruption      Joni Reining, PA-C 12/01/15 2310  Sharyn Creamer, MD 12/02/15 [phone]

## 2015-12-01 NOTE — ED Notes (Signed)
Whole body rash, which he googled and believes it is scabies

## 2015-12-01 NOTE — Discharge Instructions (Signed)
Take medication as directed.

## 2015-12-01 NOTE — ED Notes (Signed)
AAOx3.  Skin warm and dry.  NAD 

## 2015-12-02 ENCOUNTER — Ambulatory Visit (INDEPENDENT_AMBULATORY_CARE_PROVIDER_SITE_OTHER): Payer: 59 | Admitting: Nurse Practitioner

## 2015-12-02 ENCOUNTER — Encounter: Payer: Self-pay | Admitting: Nurse Practitioner

## 2015-12-02 VITALS — BP 104/62 | HR 98 | Temp 98.5°F | Resp 16 | Ht 68.0 in | Wt 154.0 lb

## 2015-12-02 DIAGNOSIS — B20 Human immunodeficiency virus [HIV] disease: Secondary | ICD-10-CM

## 2015-12-02 DIAGNOSIS — R21 Rash and other nonspecific skin eruption: Secondary | ICD-10-CM

## 2015-12-02 NOTE — Patient Instructions (Signed)
Atarax- take the pill for itching as directed- this is one to definitely get filled.   Topicort- fill for the rash to put on if the itching does not resolve with the atarax  Medrol Dospak- fill if rash worsens.

## 2015-12-02 NOTE — Progress Notes (Signed)
Patient ID: Tyler Shannon, male    DOB: [birth year]  Age: 25 y.o. MRN: [id number]  CC: Rash   HPI Tyler Shannon presents for meeting me (former [facility] pt) and follow up from ER.   1) Pt reports Tyler Shannon went to ED last night for rash.  12/01/15 [facility] ED 4 days of pruritic diffuse rash, benadryl helpful  No pruritis today. Tyler Shannon has not filled any of the medications and wants clarification on what to take and when. Tyler Shannon feels improved today  Tyler Shannon is also due to have his CD4 count, but reports Tyler Shannon cannot afford the copay for ID at this time. Tyler Shannon did verbalize understanding of need to continue his medications and seeing ID if his CD4 count is low.   History Tyler Shannon has a past medical history of Hypokalemia (05/14/13) and HIV infection (HCC).   Tyler Shannon has past surgical history that includes Tonsillectomy and adenoidectomy (2007).   His family history includes Arthritis in his maternal grandmother; Asthma in his sister; Cancer in his maternal grandfather; Diabetes in his maternal grandmother; Hypertension in his maternal grandmother and mother.Tyler Shannon reports that Tyler Shannon has never smoked. Tyler Shannon has never used smokeless tobacco. Tyler Shannon reports that Tyler Shannon drinks alcohol. 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  Outpatient Prescriptions Prior to Visit  Medication Sig Dispense Refill  . acetaminophen-codeine (TYLENOL #2) 300-15 MG tablet Take 1 tablet by mouth every 4 (four) hours as needed for moderate pain. 20 tablet 0  . desoximetasone (TOPICORT) 0.25 % cream Apply 1 application topically 2 (two) times daily. 30 g 0  . elvitegravir-cobicistat-emtricitabine-tenofovir (STRIBILD) 150-150-200-300 MG TABS tablet Take 1 tablet by mouth daily. Take with food 30 tablet 5  . hydrOXYzine (ATARAX/VISTARIL) 50 MG tablet Take 1 tablet (50 mg total) by mouth 3 (three) times daily as needed. 30 tablet 0  . methylPREDNISolone (MEDROL DOSEPAK) 4 MG TBPK tablet Take Tapered dose as directed 21 tablet 0  . acyclovir  (ZOVIRAX) 800 MG tablet Take 1 tablet (800 mg total) by mouth 2 (two) times daily. For 5 days (Patient not taking: Reported on 12/02/2015) 10 tablet 0  . guaiFENesin-codeine (CHERATUSSIN AC) 100-10 MG/5ML syrup Take 10 ml q 4 to 6 h prn cough (Patient not taking: Reported on 12/02/2015) 120 mL 0  . moxifloxacin (AVELOX) 400 MG tablet Take 1 tablet (400 mg total) by mouth daily at 8 pm. (Patient not taking: Reported on 12/02/2015) 7 tablet 0  . potassium chloride 20 MEQ TBCR Take 2 tabs daily for 3 days (Patient not taking: Reported on 12/02/2015) 6 tablet 0  . ranitidine (ZANTAC) 150 MG tablet Take 1 tablet (150 mg total) by mouth 2 (two) times daily. (Patient not taking: Reported on 12/02/2015) 60 tablet 1   No facility-administered medications prior to visit.    ROS Review of Systems  Constitutional: Negative for fever, chills, diaphoresis and fatigue.  Eyes: Negative for visual disturbance.  Respiratory: Negative for chest tightness, shortness of breath and wheezing.   Cardiovascular: Negative for chest pain, palpitations and leg swelling.  Gastrointestinal: Negative for nausea, vomiting and diarrhea.  Skin: Positive for rash.  Neurological: Negative for dizziness, weakness and numbness.  Psychiatric/Behavioral: The patient is not nervous/anxious.     Objective:  BP 104/62 mmHg  Pulse 98  Temp(Src) 98.5 F (36.9 C) (Oral)  Resp 16  Ht  (1.727 m)  Wt 154 lb (69.854 kg)  BMI 23.42 kg/m2  SpO2 98%  Physical Exam  Constitutional: Tyler Shannon is oriented to person, place, and time. Tyler Shannon appears well-developed and well-nourished. No distress.  HENT:  Head: Normocephalic and atraumatic.  Right Ear: External ear normal.  Left Ear: External ear normal.  Cardiovascular: Normal rate, regular rhythm and normal heart sounds.  Exam reveals no gallop and no friction rub.   No murmur heard. Pulmonary/Chest: Effort normal and breath sounds normal. No respiratory distress. Tyler Shannon has no wheezes. Tyler Shannon has no  rales. Tyler Shannon exhibits no tenderness.  Neurological: Tyler Shannon is alert and oriented to person, place, and time.  Skin: Skin is warm and dry. Rash noted. Tyler Shannon is not diaphoretic.  Back and arms show papular diffuse rash  Psychiatric: Tyler Shannon has a normal mood and affect. His behavior is normal. Judgment and thought content normal.      Assessment & Plan:   Tyler Shannon was seen today for rash.  Diagnoses and all orders for this visit:  HIV disease (HCC) -     Cancel: T-helper cells (CD4) count (not at [facility]) -     Cancel: T-helper cells (CD4) count -     T-helper cells (CD4) count  I am having Tyler Shannon maintain his elvitegravir-cobicistat-emtricitabine-tenofovir, acyclovir, moxifloxacin, Potassium Chloride ER, guaiFENesin-codeine, ranitidine, acetaminophen-codeine, methylPREDNISolone, hydrOXYzine, and desoximetasone.  No orders of the defined types were placed in this encounter.     Follow-up: Return if symptoms worsen or fail to improve.

## 2015-12-03 DIAGNOSIS — R21 Rash and other nonspecific skin eruption: Secondary | ICD-10-CM | POA: Insufficient documentation

## 2015-12-03 LAB — T-HELPER CELLS (CD4) COUNT (NOT AT ARMC)
Absolute CD4: 720 cells/uL (ref 381–1469)
CD4 T Helper %: 33 % (ref 32–62)
Total lymphocyte count: 2187 cells/uL (ref 700–3300)

## 2015-12-03 NOTE — Assessment & Plan Note (Signed)
Since it is improving we just discussed what to fill and when to take. Advice reiterated on AVS.  FU prn worsening/failure to improve.

## 2015-12-03 NOTE — Assessment & Plan Note (Signed)
Pt on Stribild and states he needs to get his refills.  Checking CD4 today Advised if worsening he will need to follow up with ID. He verbalized understanding.

## 2016-03-16 ENCOUNTER — Emergency Department: Payer: 59

## 2016-03-16 ENCOUNTER — Emergency Department
Admission: EM | Admit: 2016-03-16 | Discharge: 2016-03-16 | Disposition: A | Discharge status: Home / Self Care | Payer: 59 | Attending: Emergency Medicine | Admitting: Emergency Medicine

## 2016-03-16 ENCOUNTER — Encounter: Payer: Self-pay | Admitting: Emergency Medicine

## 2016-03-16 DIAGNOSIS — Y929 Unspecified place or not applicable: Secondary | ICD-10-CM | POA: Insufficient documentation

## 2016-03-16 DIAGNOSIS — Y939 Activity, unspecified: Secondary | ICD-10-CM | POA: Insufficient documentation

## 2016-03-16 DIAGNOSIS — F1210 Cannabis abuse, uncomplicated: Secondary | ICD-10-CM | POA: Diagnosis not present

## 2016-03-16 DIAGNOSIS — S92351A Displaced fracture of fifth metatarsal bone, right foot, initial encounter for closed fracture: Secondary | ICD-10-CM | POA: Insufficient documentation

## 2016-03-16 DIAGNOSIS — W1839XA Other fall on same level, initial encounter: Secondary | ICD-10-CM | POA: Diagnosis not present

## 2016-03-16 DIAGNOSIS — S99921A Unspecified injury of right foot, initial encounter: Secondary | ICD-10-CM | POA: Diagnosis present

## 2016-03-16 DIAGNOSIS — B20 Human immunodeficiency virus [HIV] disease: Secondary | ICD-10-CM | POA: Insufficient documentation

## 2016-03-16 DIAGNOSIS — Y999 Unspecified external cause status: Secondary | ICD-10-CM | POA: Diagnosis not present

## 2016-03-16 DIAGNOSIS — S92301A Fracture of unspecified metatarsal bone(s), right foot, initial encounter for closed fracture: Secondary | ICD-10-CM

## 2016-03-16 MED ORDER — TRAMADOL HCL 50 MG PO TABS: 50.0000 mg | ORAL_TABLET | Freq: Four times a day (QID) | ORAL | Status: AC | PRN

## 2016-03-16 NOTE — ED Notes (Signed)
FELL IN TUB Saturday AND INJURED RIGHT FOOT.

## 2016-03-16 NOTE — ED Notes (Signed)
Patient had fall from tub Saturday at 4pm.  Patient reports being unable to tolerate standing or putting pressure on foot since accident.  Patient verbalized that he has been icing right foot intermittently, elevating leg and attempting to rest leg with no improvement with pain.  Right foot is swollen, tender to touch and bruised.  2 + dorsalis pedis pulse present.

## 2016-03-16 NOTE — ED Provider Notes (Signed)
[facility] Emergency Department Provider Note ____________________________________________  Time seen: Approximately 11:41 AM  I have reviewed the triage vital signs and the nursing notes.   HISTORY  Chief Complaint Foot Injury    HPI Tyler Shannon is a 25 y.o. male who presents to the emergency department for evaluation of right foot pain. He states he fell in the tub Saturday and has had pain since. He is unable to bear weight. He had had little relief with ice and elevation.  Past Medical History  Diagnosis Date  . Hypokalemia 05/14/13  . HIV infection [facility](HCC)     Patient Active Problem List   Diagnosis Date Noted  . Rash and nonspecific skin eruption 12/03/2015  . HIV disease (HCC) 04/27/2014  . Herpes genitalis in men 04/18/2014  . Penile ulcer 12/09/2013  . 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 Scabies infestation 11/03/2013  . Loss of weight 08/16/2013  . Screen for STD (sexually transmitted disease) 08/14/2013  . Routine general medical examination at a health care facility 05/30/2013    Past Surgical History  Procedure Laterality Date  . Tonsillectomy and adenoidectomy  2007    Current Outpatient Rx  Name  Route  Sig  Dispense  Refill  . acetaminophen-codeine (TYLENOL #2) 300-15 MG tablet   Oral   Take 1 tablet by mouth every 4 (four) hours as needed for moderate pain.   20 tablet   0   . desoximetasone (TOPICORT) 0.25 % cream   Topical   Apply 1 application topically 2 (two) times daily.   30 g   0   . elvitegravir-cobicistat-emtricitabine-tenofovir (STRIBILD) 150-150-200-300 MG TABS tablet   Oral   Take 1 tablet by mouth daily. Take with food   30 tablet   5   . hydrOXYzine (ATARAX/VISTARIL) 50 MG tablet   Oral   Take 1 tablet (50 mg total) by mouth 3 (three) times daily as needed.   30 tablet   0   . methylPREDNISolone (MEDROL DOSEPAK) 4 MG TBPK tablet      Take Tapered dose as directed   21 tablet   0   .  traMADol (ULTRAM) 50 MG tablet   Oral   Take 1 tablet (50 mg total) by mouth every 6 (six) hours as needed.   12 tablet   0     Allergies Lorabid  Family History  Problem Relation Age of Onset  . Hypertension Mother   . Asthma Sister   . Arthritis Maternal Grandmother   . Hypertension Maternal Grandmother   . Diabetes Maternal Grandmother   . Cancer Maternal Grandfather     lung ca    Social History Social History  Substance Use Topics  . Smoking status: Never Smoker   . Smokeless tobacco: Never Used  . Alcohol Use: Yes     Comment: Occassionally has a drink - beer or mixed drink    Review of Systems Constitutional: No recent illness. Cardiovascular: Denies chest pain or palpitations. Respiratory: Denies shortness of breath. Musculoskeletal: Pain in right foot. Skin: Negative for rash, wound, lesion. Positive for contusion. Neurological: Negative for focal weakness or numbness.  ____________________________________________   PHYSICAL EXAM:  VITAL SIGNS: ED Triage Vitals  Enc Vitals Group     BP 03/16/16 1038 107/72 mmHg     Pulse Rate 03/16/16 1038 78     Resp 03/16/16 1038 14     Temp 03/16/16 1038 98.6 F (37 C)     Temp Source 03/16/16 1038  Oral     SpO2 03/16/16 1038 98 %     Weight 03/16/16 1038 163 lb (73.936 kg)     Height 03/16/16 1038  (1.753 m)     Head Cir --      Peak Flow --      Pain Score 03/16/16 1039 5     Pain Loc --      Pain Edu? --      Excl. in GC? --     Constitutional: Alert and oriented. Well appearing and in no acute distress. Eyes: Conjunctivae are normal. EOMI. Head: Atraumatic. Neck: No stridor.  Respiratory: Normal respiratory effort.   Musculoskeletal: Lateral aspect of foot with ecchymosis. Tenderness over the 4th and 5th proximal metatarsals with mild swelling.  Neurologic:  Normal speech and language. No gross focal neurologic deficits are appreciated. Speech is normal. No gait instability. Skin:  Skin is  warm, dry and intact. Atraumatic. Psychiatric: Mood and affect are normal. Speech and behavior are normal.  ____________________________________________   LABS (all labs ordered are listed, but only abnormal results are displayed)  Labs Reviewed - No data to display ____________________________________________  RADIOLOGY  Displaced fracture of the 5th metatarsal of the right foot involving the adjacent TMT joint.  I, Kem Boroughs, personally viewed and evaluated these images (plain radiographs) as part of my medical decision making, as well as reviewing the written report by the radiologist.  ____________________________________________   PROCEDURES  Procedure(s) performed:  SPLINT APPLICATION  Authorized by: Kem Boroughs Consent: Verbal consent obtained. Risks and benefits: risks, benefits and alternatives were discussed Consent given by: patient Splint applied by: [facility] ER technician Location details: right foot Splint type: Volar Supplies used: OCL and ACE Post-procedure: The splinted body part was neurovascularly unchanged following the procedure. Patient tolerance: Patient tolerated the procedure well with no immediate complications.  Follow up greater than 24 hours. Initial fracture care provided.   ____________________________________________   INITIAL IMPRESSION / ASSESSMENT AND PLAN / ED COURSE  Pertinent labs & imaging results that were available during my care of the patient were reviewed by me and considered in my medical decision making (see chart for details).  He was advised to follow up podiatry. He was also advised to return to the emergency department for symptoms that change or worsen if unable to schedule an appointment. Crutches provided. Prescription for tramadol given.   ____________________________________________   FINAL CLINICAL IMPRESSION(S) / ED DIAGNOSES  Final diagnoses:  Metatarsal fracture, right, closed, initial encounter        Chinita Pester, FNP 03/16/16 1325  Jene Every, MD 03/16/16 1506

## 2016-03-16 NOTE — Discharge Instructions (Signed)
Metatarsal Fracture A metatarsal fracture is a break in a metatarsal bone. Metatarsal bones connect your toe bones to your ankle bones. CAUSES This type of fracture may be caused by:  A sudden twisting of your foot.  A fall onto your foot.  Overuse or repetitive exercise. RISK FACTORS This condition is more likely to develop in people who:  Play contact sports.  Have a bone disease.  Have a low calcium level. SYMPTOMS Symptoms of this condition include:  Pain that is worse when walking or standing.  Pain when pressing on the foot or moving the toes.  Swelling.  Bruising on the top or bottom of the foot.  A foot that appears shorter than the other one. DIAGNOSIS This condition is diagnosed with a physical exam. You may also have imaging tests, such as:  X-rays.  A CT scan.  MRI. TREATMENT Treatment for this condition depends on its severity and whether a bone has moved out of place. Treatment may involve:  Rest.  Wearing foot support such as a cast, splint, or boot for several weeks.  Using crutches.  Surgery to move bones back into the right position. Surgery is usually needed if there are many pieces of broken bone or bones that are very out of place (displaced fracture).  Physical therapy. This may be needed to help you regain full movement and strength in your foot. You will need to return to your health care provider to have X-rays taken until your bones heal. Your health care provider will look at the X-rays to make sure that your foot is healing well. HOME CARE INSTRUCTIONS  If You Have a Cast:  Do not stick anything inside the cast to scratch your skin. Doing that increases your risk of infection.  Check the skin around the cast every day. Report any concerns to your health care provider. You may put lotion on dry skin around the edges of the cast. Do not apply lotion to the skin underneath the cast.  Keep the cast clean and dry. If You Have a Splint  or a Supportive Boot:  Wear it as directed by your health care provider. Remove it only as directed by your health care provider.  Loosen it if your toes become numb and tingle, or if they turn cold and blue.  Keep it clean and dry. Bathing  Do not take baths, swim, or use a hot tub until your health care provider approves. Ask your health care provider if you can take showers. You may only be allowed to take sponge baths for bathing.  If your health care provider approves bathing and showering, cover the cast or splint with a watertight plastic bag to protect it from water. Do not let the cast or splint get wet. Managing Pain, Stiffness, and Swelling  If directed, apply ice to the injured area (if you have a splint, not a cast).  Put ice in a plastic bag.  Place a towel between your skin and the bag.  Leave the ice on for 20 minutes, 2-3 times per day.  Move your toes often to avoid stiffness and to lessen swelling.  Raise (elevate) the injured area above the level of your heart while you are sitting or lying down. Driving  Do not drive or operate heavy machinery while taking pain medicine.  Do not drive while wearing foot support on a foot that you use for driving. Activity  Return to your normal activities as directed by your health care   provider. Ask your health care provider what activities are safe for you.  Perform exercises as directed by your health care provider or physical therapist. Safety  Do not use the injured foot to support your body weight until your health care provider says that you can. Use crutches as directed by your health care provider. General Instructions  Do not put pressure on any part of the cast or splint until it is fully hardened. This may take several hours.  Do not use any tobacco products, including cigarettes, chewing tobacco, or e-cigarettes. Tobacco can delay bone healing. If you need help quitting, ask your health care  provider.  Take medicines only as directed by your health care provider.  Keep all follow-up visits as directed by your health care provider. This is important. SEEK MEDICAL CARE IF:  You have a fever.  Your cast, splint, or boot is too loose or too tight.  Your cast, splint, or boot is damaged.  Your pain medicine is not helping.  You have pain, tingling, or numbness in your foot that is not going away. SEEK IMMEDIATE MEDICAL CARE IF:  You have severe pain.  You have tingling or numbness in your foot that is getting worse.  Your foot feels cold or becomes numb.  Your foot changes color.   This information is not intended to replace advice given to you by your health care provider. Make sure you discuss any questions you have with your health care provider.   Document Released: 07/11/2002 Document Revised: 03/05/2015 Document Reviewed: 08/15/2014 Elsevier Interactive Patient Education 2016 [employer]  

## 2016-03-17 DIAGNOSIS — S92354A Nondisplaced fracture of fifth metatarsal bone, right foot, initial encounter for closed fracture: Secondary | ICD-10-CM | POA: Diagnosis not present

## 2016-04-14 DIAGNOSIS — S92351D Displaced fracture of fifth metatarsal bone, right foot, subsequent encounter for fracture with routine healing: Secondary | ICD-10-CM | POA: Diagnosis not present

## 2016-04-30 ENCOUNTER — Emergency Department (HOSPITAL_COMMUNITY): Payer: 59

## 2016-04-30 ENCOUNTER — Encounter (HOSPITAL_COMMUNITY): Payer: Self-pay

## 2016-04-30 ENCOUNTER — Inpatient Hospital Stay (HOSPITAL_COMMUNITY)
Admission: EM | Admit: 2016-04-30 | Discharge: 2016-05-02 | DRG: 076 | Disposition: A | Discharge status: Home / Self Care | Payer: 59 | Attending: Internal Medicine | Admitting: Internal Medicine

## 2016-04-30 DIAGNOSIS — A879 Viral meningitis, unspecified: Principal | ICD-10-CM | POA: Diagnosis present

## 2016-04-30 DIAGNOSIS — R509 Fever, unspecified: Secondary | ICD-10-CM | POA: Diagnosis not present

## 2016-04-30 DIAGNOSIS — Z833 Family history of diabetes mellitus: Secondary | ICD-10-CM | POA: Diagnosis not present

## 2016-04-30 DIAGNOSIS — G43909 Migraine, unspecified, not intractable, without status migrainosus: Secondary | ICD-10-CM | POA: Diagnosis present

## 2016-04-30 DIAGNOSIS — Z21 Asymptomatic human immunodeficiency virus [HIV] infection status: Secondary | ICD-10-CM | POA: Diagnosis present

## 2016-04-30 DIAGNOSIS — Z9114 Patient's other noncompliance with medication regimen: Secondary | ICD-10-CM

## 2016-04-30 DIAGNOSIS — Z8661 Personal history of infections of the central nervous system: Secondary | ICD-10-CM | POA: Diagnosis not present

## 2016-04-30 DIAGNOSIS — Z825 Family history of asthma and other chronic lower respiratory diseases: Secondary | ICD-10-CM | POA: Diagnosis not present

## 2016-04-30 DIAGNOSIS — B20 Human immunodeficiency virus [HIV] disease: Secondary | ICD-10-CM | POA: Diagnosis present

## 2016-04-30 DIAGNOSIS — R1031 Right lower quadrant pain: Secondary | ICD-10-CM | POA: Diagnosis not present

## 2016-04-30 DIAGNOSIS — Z801 Family history of malignant neoplasm of trachea, bronchus and lung: Secondary | ICD-10-CM

## 2016-04-30 DIAGNOSIS — Z8249 Family history of ischemic heart disease and other diseases of the circulatory system: Secondary | ICD-10-CM | POA: Diagnosis not present

## 2016-04-30 DIAGNOSIS — R945 Abnormal results of liver function studies: Secondary | ICD-10-CM | POA: Diagnosis not present

## 2016-04-30 DIAGNOSIS — G039 Meningitis, unspecified: Secondary | ICD-10-CM | POA: Diagnosis not present

## 2016-04-30 DIAGNOSIS — Z881 Allergy status to other antibiotic agents status: Secondary | ICD-10-CM | POA: Diagnosis not present

## 2016-04-30 DIAGNOSIS — R51 Headache: Secondary | ICD-10-CM | POA: Diagnosis not present

## 2016-04-30 LAB — URINE MICROSCOPIC-ADD ON

## 2016-04-30 LAB — CBC
HCT: 38.5 % — ABNORMAL LOW (ref 39.0–52.0)
Hemoglobin: 12.9 g/dL — ABNORMAL LOW (ref 13.0–17.0)
MCH: 27 pg (ref 26.0–34.0)
MCHC: 33.5 g/dL (ref 30.0–36.0)
MCV: 80.7 fL (ref 78.0–100.0)
PLATELETS: 234 10*3/uL (ref 150–400)
RBC: 4.77 MIL/uL (ref 4.22–5.81)
RDW: 13.2 % (ref 11.5–15.5)
WBC: 8.2 10*3/uL (ref 4.0–10.5)

## 2016-04-30 LAB — URINALYSIS, ROUTINE W REFLEX MICROSCOPIC
BILIRUBIN URINE: NEGATIVE
Glucose, UA: NEGATIVE mg/dL
Hgb urine dipstick: NEGATIVE
KETONES UR: NEGATIVE mg/dL
Leukocytes, UA: NEGATIVE
NITRITE: NEGATIVE
PH: 8.5 — AB (ref 5.0–8.0)
PROTEIN: 30 mg/dL — AB
Specific Gravity, Urine: 1.028 (ref 1.005–1.030)

## 2016-04-30 LAB — COMPREHENSIVE METABOLIC PANEL
ALBUMIN: 4.7 g/dL (ref 3.5–5.0)
ALBUMIN: 3.9 g/dL (ref 3.5–5.0)
ALK PHOS: 155 U/L — AB (ref 38–126)
ALK PHOS: 123 U/L (ref 38–126)
ALT: 207 U/L — ABNORMAL HIGH (ref 17–63)
ALT: 152 U/L — ABNORMAL HIGH (ref 17–63)
ANION GAP: 7 (ref 5–15)
AST: 113 U/L — AB (ref 15–41)
AST: 61 U/L — ABNORMAL HIGH (ref 15–41)
Anion gap: 10 (ref 5–15)
BILIRUBIN TOTAL: 0.7 mg/dL (ref 0.3–1.2)
BUN: 9 mg/dL (ref 6–20)
BUN: 6 mg/dL (ref 6–20)
CALCIUM: 9.8 mg/dL (ref 8.9–10.3)
CALCIUM: 8.7 mg/dL — AB (ref 8.9–10.3)
CHLORIDE: 110 mmol/L (ref 101–111)
CO2: 21 mmol/L — ABNORMAL LOW (ref 22–32)
CO2: 23 mmol/L (ref 22–32)
CREATININE: 0.74 mg/dL (ref 0.61–1.24)
Chloride: 108 mmol/L (ref 101–111)
Creatinine, Ser: 0.69 mg/dL (ref 0.61–1.24)
GFR calc Af Amer: >60 mL/min (ref >60)
GFR calc Af Amer: >60 mL/min (ref >60)
GFR calc non Af Amer: >60 mL/min (ref >60)
GLUCOSE: 115 mg/dL — AB (ref 65–99)
GLUCOSE: 127 mg/dL — AB (ref 65–99)
Potassium: 3.4 mmol/L — ABNORMAL LOW (ref 3.5–5.1)
Potassium: 3.4 mmol/L — ABNORMAL LOW (ref 3.5–5.1)
SODIUM: 140 mmol/L (ref 135–145)
Sodium: 139 mmol/L (ref 135–145)
TOTAL PROTEIN: 9 g/dL — AB (ref 6.5–8.1)
Total Bilirubin: 0.6 mg/dL (ref 0.3–1.2)
Total Protein: 7.6 g/dL (ref 6.5–8.1)

## 2016-04-30 LAB — CBC WITH DIFFERENTIAL/PLATELET
BASOS PCT: 0 %
Basophils Absolute: 0 10*3/uL (ref 0.0–0.1)
Eosinophils Absolute: 0 10*3/uL (ref 0.0–0.7)
Eosinophils Relative: 0 %
HEMATOCRIT: 34.2 % — AB (ref 39.0–52.0)
HEMOGLOBIN: 11.8 g/dL — AB (ref 13.0–17.0)
LYMPHS ABS: 1.1 10*3/uL (ref 0.7–4.0)
LYMPHS PCT: 16 %
MCH: 27.3 pg (ref 26.0–34.0)
MCHC: 34.5 g/dL (ref 30.0–36.0)
MCV: 79.2 fL (ref 78.0–100.0)
MONO ABS: 0.2 10*3/uL (ref 0.1–1.0)
MONOS PCT: 3 %
NEUTROS ABS: 5.8 10*3/uL (ref 1.7–7.7)
NEUTROS PCT: 81 %
Platelets: 213 10*3/uL (ref 150–400)
RBC: 4.32 MIL/uL (ref 4.22–5.81)
RDW: 13 % (ref 11.5–15.5)
WBC: 7.1 10*3/uL (ref 4.0–10.5)

## 2016-04-30 LAB — DIFFERENTIAL
BASOS ABS: 0 10*3/uL (ref 0.0–0.1)
BASOS PCT: 0 %
EOS ABS: 0.1 10*3/uL (ref 0.0–0.7)
Eosinophils Relative: 1 %
LYMPHS ABS: 1.3 10*3/uL (ref 0.7–4.0)
Lymphocytes Relative: 15 %
MONO ABS: 0.4 10*3/uL (ref 0.1–1.0)
MONOS PCT: 5 %
Neutro Abs: 6.5 10*3/uL (ref 1.7–7.7)
Neutrophils Relative %: 79 %

## 2016-04-30 LAB — CSF CELL COUNT WITH DIFFERENTIAL
Eosinophils, CSF: 0 % (ref 0–1)
Lymphs, CSF: 65 % (ref 40–80)
MONOCYTE-MACROPHAGE-SPINAL FLUID: 31 % (ref 15–45)
RBC COUNT CSF: 4 /mm3 — AB
Segmented Neutrophils-CSF: 4 % (ref 0–6)
Tube #: 1
WBC, CSF: 267 /mm3 (ref 0–5)

## 2016-04-30 LAB — GLUCOSE, CSF: GLUCOSE CSF: 62 mg/dL (ref 40–70)

## 2016-04-30 LAB — I-STAT CG4 LACTIC ACID, ED: LACTIC ACID, VENOUS: 1.71 mmol/L (ref 0.5–1.9)

## 2016-04-30 LAB — CRYPTOCOCCAL ANTIGEN, CSF: CRYPTO AG: NEGATIVE

## 2016-04-30 LAB — PROTEIN, CSF: TOTAL PROTEIN, CSF: 47 mg/dL — AB (ref 15–45)

## 2016-04-30 LAB — LIPASE, BLOOD: Lipase: 15 U/L (ref 11–51)

## 2016-04-30 MED ORDER — VANCOMYCIN HCL IN DEXTROSE 1-5 GM/200ML-% IV SOLN
1000.0000 mg | Freq: Once | INTRAVENOUS | Status: AC
  Administered 2016-04-30: 1000 mg via INTRAVENOUS
  Filled 2016-04-30: qty 200

## 2016-04-30 MED ORDER — IOPAMIDOL (ISOVUE-300) INJECTION 61%
100.0000 mL | Freq: Once | INTRAVENOUS | Status: AC | PRN
  Administered 2016-04-30: 100 mL via INTRAVENOUS

## 2016-04-30 MED ORDER — HYDROMORPHONE HCL 1 MG/ML IJ SOLN: 0.5000 mg | INTRAMUSCULAR | Status: DC | PRN

## 2016-04-30 MED ORDER — ACETAMINOPHEN 650 MG RE SUPP: 650.0000 mg | Freq: Four times a day (QID) | RECTAL | Status: DC | PRN

## 2016-04-30 MED ORDER — VANCOMYCIN HCL IN DEXTROSE 1-5 GM/200ML-% IV SOLN
1000.0000 mg | Freq: Three times a day (TID) | INTRAVENOUS | Status: DC
  Administered 2016-05-01 – 2016-05-02 (×4): 1000 mg via INTRAVENOUS
  Filled 2016-04-30 (×4): qty 200

## 2016-04-30 MED ORDER — HYDROCODONE-ACETAMINOPHEN 5-325 MG PO TABS
1.0000 | ORAL_TABLET | ORAL | Status: DC | PRN
  Administered 2016-05-01: 2 via ORAL
  Filled 2016-04-30: qty 2

## 2016-04-30 MED ORDER — KETOROLAC TROMETHAMINE 30 MG/ML IJ SOLN
30.0000 mg | Freq: Once | INTRAMUSCULAR | Status: AC
  Administered 2016-04-30: 30 mg via INTRAVENOUS
  Filled 2016-04-30: qty 1

## 2016-04-30 MED ORDER — ONDANSETRON HCL 4 MG/2ML IJ SOLN: 4.0000 mg | Freq: Four times a day (QID) | INTRAMUSCULAR | Status: DC | PRN

## 2016-04-30 MED ORDER — LIDOCAINE HCL 1 % IJ SOLN
INTRAMUSCULAR | Status: AC
  Administered 2016-04-30: 20 mL
  Filled 2016-04-30: qty 20

## 2016-04-30 MED ORDER — DEXAMETHASONE SODIUM PHOSPHATE 10 MG/ML IJ SOLN
10.0000 mg | Freq: Once | INTRAMUSCULAR | Status: AC
Start: 2016-04-30 — End: 2016-04-30
  Administered 2016-04-30: 10 mg via INTRAVENOUS
  Filled 2016-04-30: qty 1

## 2016-04-30 MED ORDER — DEXTROSE 5 % IV SOLN
2.0000 g | Freq: Two times a day (BID) | INTRAVENOUS | Status: DC
  Administered 2016-04-30 – 2016-05-02 (×3): 2 g via INTRAVENOUS
  Filled 2016-04-30 (×5): qty 2

## 2016-04-30 MED ORDER — ACETAMINOPHEN 325 MG PO TABS
650.0000 mg | ORAL_TABLET | Freq: Four times a day (QID) | ORAL | Status: DC | PRN
  Administered 2016-05-01 – 2016-05-02 (×3): 650 mg via ORAL
  Filled 2016-04-30 (×3): qty 2

## 2016-04-30 MED ORDER — ACETAMINOPHEN 325 MG PO TABS
650.0000 mg | ORAL_TABLET | Freq: Once | ORAL | Status: AC
  Administered 2016-04-30: 650 mg via ORAL
  Filled 2016-04-30: qty 2

## 2016-04-30 MED ORDER — SODIUM CHLORIDE 0.9 % IV BOLUS (SEPSIS)
1000.0000 mL | Freq: Once | INTRAVENOUS | Status: AC
  Administered 2016-04-30: 1000 mL via INTRAVENOUS

## 2016-04-30 MED ORDER — SODIUM CHLORIDE 0.9 % IV BOLUS (SEPSIS)
30.0000 mL/kg | Freq: Once | INTRAVENOUS | Status: AC
  Administered 2016-04-30: 2178 mL via INTRAVENOUS

## 2016-04-30 MED ORDER — SULFAMETHOXAZOLE-TRIMETHOPRIM 400-80 MG/5ML IV SOLN: 5.0000 mg/kg | Freq: Once | INTRAVENOUS | Status: DC

## 2016-04-30 MED ORDER — ACETAMINOPHEN 325 MG PO TABS
650.0000 mg | ORAL_TABLET | Freq: Once | ORAL | Status: AC | PRN
  Administered 2016-04-30: 650 mg via ORAL
  Filled 2016-04-30: qty 2

## 2016-04-30 MED ORDER — TRAMADOL HCL 50 MG PO TABS: 50.0000 mg | ORAL_TABLET | Freq: Four times a day (QID) | ORAL | Status: DC | PRN

## 2016-04-30 MED ORDER — DEXTROSE 5 % IV SOLN
10.0000 mg/kg | Freq: Once | INTRAVENOUS | Status: AC
  Administered 2016-04-30: 725 mg via INTRAVENOUS
  Filled 2016-04-30: qty 14.5

## 2016-04-30 MED ORDER — ONDANSETRON HCL 4 MG PO TABS: 4.0000 mg | ORAL_TABLET | Freq: Four times a day (QID) | ORAL | Status: DC | PRN

## 2016-04-30 MED ORDER — DEXTROSE 5 % IV SOLN
10.0000 mg/kg | Freq: Three times a day (TID) | INTRAVENOUS | Status: DC
  Administered 2016-05-01 – 2016-05-02 (×4): 725 mg via INTRAVENOUS
  Filled 2016-04-30 (×6): qty 14.5

## 2016-04-30 MED ORDER — SODIUM CHLORIDE 0.9 % IV SOLN
INTRAVENOUS | Status: AC
  Administered 2016-04-30: 125 mL/h via INTRAVENOUS

## 2016-04-30 MED ORDER — SODIUM CHLORIDE 0.9 % IV SOLN
INTRAVENOUS | Status: AC
  Administered 2016-04-30: 22:00:00 via INTRAVENOUS

## 2016-04-30 MED ORDER — LIDOCAINE HCL 1 % IJ SOLN
20.0000 mL | Freq: Once | INTRAMUSCULAR | Status: AC
  Administered 2016-04-30: 20 mL

## 2016-04-30 MED ORDER — PROCHLORPERAZINE EDISYLATE 5 MG/ML IJ SOLN
10.0000 mg | Freq: Once | INTRAMUSCULAR | Status: AC
Start: 2016-04-30 — End: 2016-04-30
  Administered 2016-04-30: 10 mg via INTRAVENOUS
  Filled 2016-04-30: qty 2

## 2016-04-30 MED ORDER — FENTANYL CITRATE (PF) 100 MCG/2ML IJ SOLN
50.0000 ug | Freq: Once | INTRAMUSCULAR | Status: AC
  Administered 2016-04-30: 50 ug via INTRAVENOUS
  Filled 2016-04-30: qty 2

## 2016-04-30 NOTE — ED Notes (Signed)
Report given to nurse on the floor. 

## 2016-04-30 NOTE — ED Notes (Signed)
Pt presents with c/o headache that started yesterday. Pt denies any light sensitivity at this time but reports that he has been vomiting. Pt denies any hx of migraine.

## 2016-04-30 NOTE — Progress Notes (Signed)
Pharmacy Antibiotic Note  Tyler Shannon is a 25 y.o. male admitted on 04/30/2016 with meningitis.  Pharmacy has been consulted for Vanc/acyclovir dosing.  Plan: 1) Vancomycin 1g IV q8. Goal trough 15-20 mcg/mL 2) Acylovir 10mg /kg IV q8 3) Rocephin 2g IV q12 per PA   Height: 5\' 8"  (172.7 cm) Weight: 160 lb (72.576 kg) IBW/kg (Calculated) : 68.4  Temp (24hrs), Avg:99.9 F (37.7 C), Min:98.7 F (37.1 C), Max:100.9 F (38.3 C)   Recent Labs Lab 04/30/16 1205 04/30/16 1217  WBC 8.2  --   CREATININE 0.74  --   LATICACIDVEN  --  1.71    Estimated Creatinine Clearance: 137.8 mL/min (by C-G formula based on Cr of 0.74).    Allergies  Allergen Reactions  . Lorabid [Loracarbef] Rash     Thank you for allowing pharmacy to be a part of this patient's care.   Hessie KnowsJustin M Rhyann Berton, PharmD, BCPS Pager [phone] 04/30/2016 6:26 PM

## 2016-04-30 NOTE — H&P (Signed)
Tyler Shannon:[id number] DOB: Oct 12, 1991 DOA: 04/30/2016     PCP: Loel Lofty, NP   Outpatient Specialists: ID Comer Patient coming from:   home Lives With family    Chief Complaint: Headache and nausea  HPI: Tyler Shannon is a 25 y.o. male with medical history significant of HIV infection noncompliance, genital herpes, scabies infestation    Presented with  2 days of headache no light sensitivity but nausea and associated vomiting. He denied any neck stiffness or neck pain initially was somewhat confused. Denied any fever states has had pain behind his eyes. Reported mild associated abdominal pain worse with vomiting. No cough no runny nose no shortness of breath he has not attempted to treat his headaches with any medications.   Patient is known history of HIV has discontinued taking his HIV medications for past 3 weeks. Last CD4 count was 720 in January 2017   IN ER: MAXIMUM TEMPERATURE 100.9 initial heart rate up to 121 now down to 108 blood pressure 130/69 WBC 8.2 hemoglobin 12.9 potassium 3.4 lactic acid 1.7 Spinal tap was performed CSF as follows tube 1: RBC 4 WBC 267 tube 4: RBC count 2 CBC count 347. Glucose CSF 62, WBC predominant lymphocytic. Total protein 47 UA unremarkable Lipase 16 potassium 3.4 CT of abdomen showing no acute abnormality CT head normal Chest x-ray unremarkable  He was started on a cycle via IV Rocephin 2 g IV Decadron 10 mg IV Bactrim IV, vancomycin IV  Hospitalist was called for admission for meningitis presumed to be viral in etiology  Review of Systems:    Pertinent positives include: fatigue, headaches,  abdominal pain, nausea, vomiting,  Constitutional:  No weight loss, night sweats, Fevers, chills,  weight loss  HEENT:  No Difficulty swallowing,Tooth/dental problems,Sore throat,  No sneezing, itching, ear ache, nasal congestion, post nasal drip,  Cardio-vascular:  No chest pain, Orthopnea, PND, anasarca, dizziness,  palpitations.no Bilateral lower extremity swelling  GI:  No heartburn, indigestion, diarrhea, change in bowel habits, loss of appetite, melena, blood in stool, hematemesis Resp:  no shortness of breath at rest. No dyspnea on exertion, No excess mucus, no productive cough, No non-productive cough, No coughing up of blood.No change in color of mucus.No wheezing. Skin:  no rash or lesions. No jaundice GU:  no dysuria, change in color of urine, no urgency or frequency. No straining to urinate.  No flank pain.  Musculoskeletal:  No joint pain or no joint swelling. No decreased range of motion. No back pain.  Psych:  No change in mood or affect. No depression or anxiety. No memory loss.  Neuro: no localizing neurological complaints, no tingling, no weakness, no double vision, no gait abnormality, no slurred speech, no confusion  As per HPI otherwise 10 point review of systems negative.   Past Medical History: Past Medical History  Diagnosis Date  . Hypokalemia 05/14/13  . HIV infection [facility])    Past Surgical History  Procedure Laterality Date  . Tonsillectomy and adenoidectomy  2007     Social History:  Ambulatory   independently      reports that he has never smoked. He has never used smokeless tobacco. He reports that he drinks alcohol. He reports that he uses illicit drugs (Marijuana) about 91 times per week.  Allergies:   Allergies  Allergen Reactions  . Lorabid [Loracarbef] Rash       Family History:    Family History  Problem Relation Age of Onset  .  Hypertension Mother   . Asthma Sister   . Arthritis Maternal Grandmother   . Hypertension Maternal Grandmother   . Diabetes Maternal Grandmother   . Cancer Maternal Grandfather     lung ca    Medications: Prior to Admission medications   Medication Sig Start Date End Date Taking? Authorizing Provider  acetaminophen (TYLENOL) 500 MG tablet Take 500 mg by mouth every 6 (six) hours as needed for mild pain,  moderate pain, fever or headache.   Yes Historical Provider, MD  ibuprofen (ADVIL,MOTRIN) 200 MG tablet Take 600 mg by mouth every 4 (four) hours as needed for fever, headache, mild pain, moderate pain or cramping.   Yes Historical Provider, MD  elvitegravir-cobicistat-emtricitabine-tenofovir (STRIBILD) 150-150-200-300 MG TABS tablet Take 1 tablet by mouth daily. Take with food Patient not taking: Reported on 04/30/2016 08/28/14   Gardiner Barefoot, MD  traMADol (ULTRAM) 50 MG tablet Take 1 tablet (50 mg total) by mouth every 6 (six) hours as needed. Patient not taking: Reported on 04/30/2016 03/16/16   Chinita Pester, FNP    Physical Exam: Patient Vitals for the past 24 hrs:  BP Temp Temp src Pulse Resp SpO2 Height Weight  04/30/16 1800 130/68 mmHg - - 108 18 98 % - -  04/30/16 1526 - 100 F (37.8 C) Oral - - - - -  04/30/16 1348 139/83 mmHg 98.7 F (37.1 C) Oral 108 17 100 % - -  04/30/16 1211 - - - - - -  (1.727 m) 72.576 kg (160 lb)  04/30/16 1201 - - - (!) 121 - - - -  04/30/16 1154 132/71 mmHg 100.9 F (38.3 C) Oral 111 18 96 % - -    1. General:  in No Acute distress 2. Psychological: Alert and  Oriented 3. Head/ENT:     Dry Mucous Membranes                          Head Non traumatic, neck supple                          Normal  Dentition 4. SKIN:  decreased Skin turgor,  Skin clean Dry and intact no rash 5. Heart: Regular rate and rhythm no  Murmur, Rub or gallop 6. Lungs:  Clear to auscultation bilaterally, no wheezes or crackles   7. Abdomen: Soft, non-tender, Non distended 8. Lower extremities: no clubbing, cyanosis, or edema 9. Neurologically Grossly intact, moving all 4 extremities equally 10. MSK: Normal range of motion   body mass index is 24.33 kg/(m^2).  Labs on Admission:   Labs on Admission: I have personally reviewed following labs and imaging studies  CBC:  Recent Labs Lab 04/30/16 1205  WBC 8.2  NEUTROABS 6.5  HGB 12.9*  HCT 38.5*  MCV 80.7    PLT 234   Basic Metabolic Panel:  Recent Labs Lab 04/30/16 1205  NA 139  K 3.4*  CL 108  CO2 21*  GLUCOSE 115*  BUN 9  CREATININE 0.74  CALCIUM 9.8   GFR: Estimated Creatinine Clearance: 137.8 mL/min (by C-G formula based on Cr of 0.74). Liver Function Tests:  Recent Labs Lab 04/30/16 1205  AST 113*  ALT 207*  ALKPHOS 155*  BILITOT 0.7  PROT 9.0*  ALBUMIN 4.7    Recent Labs Lab 04/30/16 1205  LIPASE 15   No results for input(s): AMMONIA in the last 168 hours. Coagulation Profile:  No results for input(s): INR, PROTIME in the last 168 hours. Cardiac Enzymes: No results for input(s): CKTOTAL, CKMB, CKMBINDEX, TROPONINI in the last 168 hours. BNP (last 3 results) No results for input(s): PROBNP in the last 8760 hours. HbA1C: No results for input(s): HGBA1C in the last 72 hours. CBG: No results for input(s): GLUCAP in the last 168 hours. Lipid Profile: No results for input(s): CHOL, HDL, LDLCALC, TRIG, CHOLHDL, LDLDIRECT in the last 72 hours. Thyroid Function Tests: No results for input(s): TSH, T4TOTAL, FREET4, T3FREE, THYROIDAB in the last 72 hours. Anemia Panel: No results for input(s): VITAMINB12, FOLATE, FERRITIN, TIBC, IRON, RETICCTPCT in the last 72 hours. Urine analysis:    Component Value Date/Time   COLORURINE AMBER* 04/30/2016 1349   APPEARANCEUR CLEAR 04/30/2016 1349   LABSPEC 1.028 04/30/2016 1349   PHURINE 8.5* 04/30/2016 1349   GLUCOSEU NEGATIVE 04/30/2016 1349   HGBUR NEGATIVE 04/30/2016 1349   BILIRUBINUR NEGATIVE 04/30/2016 1349   KETONESUR NEGATIVE 04/30/2016 1349   PROTEINUR 30* 04/30/2016 1349   UROBILINOGEN 1 05/31/2014 0954   NITRITE NEGATIVE 04/30/2016 1349   LEUKOCYTESUR NEGATIVE 04/30/2016 1349   Sepsis Labs: @LABRCNTIP (procalcitonin:4,lacticidven:4) ) Recent Results (from the past 240 hour(s))  CSF culture     Status: None (Preliminary result)   Collection Time: 04/30/16  4:41 PM  Result Value Ref Range Status    Specimen Description CSF  Final   Special Requests Immunocompromised  Final   Gram Stain   Final    NO ORGANISMS SEEN WBC PRESENT, PREDOMINANTLY MONONUCLEAR Gram Stain Report Called to,Read Back By and Verified With: L.JOHNSON AT 1804 ON 04/30/16 BY S.VANHOORNE    Culture PENDING  Incomplete   Report Status PENDING  Incomplete      UA   no evidence of UTI  No results found for: HGBA1C  Estimated Creatinine Clearance: 137.8 mL/min (by C-G formula based on Cr of 0.74).  BNP (last 3 results) No results for input(s): PROBNP in the last 8760 hours.   ECG REPORT Not obtaiend  [facility]   04/30/16 1211  Weight: 72.576 kg (160 lb)     Cultures:    Component Value Date/Time   SDES CSF 04/30/2016 1641   SPECREQUEST Immunocompromised 04/30/2016 1641   CULT PENDING 04/30/2016 1641   REPTSTATUS PENDING 04/30/2016 1641     Radiological Exams on Admission: Dg Chest 2 View  04/30/2016  CLINICAL DATA:  Fever, migraines, weakness, cold, chills, and vomiting beginning yesterday, former smoker, HIV, history pneumonia EXAM: CHEST  2 VIEW COMPARISON:  11/21/2015 FINDINGS: Normal heart size, mediastinal contours, and pulmonary vascularity. Lungs clear. No pleural effusion or pneumothorax. Bones unremarkable. IMPRESSION: No acute abnormalities. Electronically Signed   By: Ulyses SouthwardMark  Boles M.D.   On: 04/30/2016 12:47   Ct Head Wo Contrast  04/30/2016  CLINICAL DATA:  Headache that began yesterday, history HIV EXAM: CT HEAD WITHOUT CONTRAST TECHNIQUE: Contiguous axial images were obtained from the base of the skull through the vertex without intravenous contrast. COMPARISON:  None FINDINGS: Normal ventricular morphology. No midline shift or mass effect. Normal appearance of brain parenchyma. No intracranial hemorrhage, mass lesion, or acute infarction. Visualized paranasal sinuses and mastoid air cells clear. Bones unremarkable. IMPRESSION: Normal exam. Electronically Signed   By: Ulyses SouthwardMark  Boles M.D.    On: 04/30/2016 15:17   Ct Abdomen Pelvis W Contrast  04/30/2016  CLINICAL DATA:  Right lower quadrant pain and elevated LFTs EXAM: CT ABDOMEN AND PELVIS WITH CONTRAST TECHNIQUE: Multidetector CT imaging of the abdomen  and pelvis was performed using the standard protocol following bolus administration of intravenous contrast. CONTRAST:  100mL ISOVUE-300 IOPAMIDOL (ISOVUE-300) INJECTION 61% COMPARISON:  None. FINDINGS: Lower chest:  No acute findings. Hepatobiliary: Diffuse fatty infiltration of the liver is noted. The gallbladder is within normal limits. Pancreas: No mass, inflammatory changes, or other significant abnormality. Spleen: Within normal limits in size and appearance. Adrenals/Urinary Tract: No masses identified. No evidence of hydronephrosis. Bladder is well distended. Stomach/Bowel: No evidence of obstruction, inflammatory process, or abnormal fluid collections. The appendix is within normal limits. Vascular/Lymphatic: No pathologically enlarged lymph nodes. No evidence of abdominal aortic aneurysm. Reproductive: No mass or other significant abnormality. Other: None. Musculoskeletal:  No suspicious bone lesions identified. IMPRESSION: No acute abnormality noted. Electronically Signed   By: Alcide CleverMark  Lukens M.D.   On: 04/30/2016 15:30    Chart has been reviewed    Assessment/Plan   25 y.o. male with medical history significant of HIV infection noncompliance, genital herpes, scabies infestation history of viral meningitis has been off of HIV medications for a few weeks last CD4 count 700 in January 2017   Present on Admission:  . HIV disease (HCC) -discussed with infectious disease who will see in consult tomorrow for now recommend holding off on his home medications. We'll check CD4 count and HIV viral load  . Viral meningitis - for now continue antibiotic coverage with make him I's and ceftriaxone as well as antiviral coverage. Discussed with ID who was seen in consult follow-up results of  CSF cultures We'll order droplet percussions Other plan as per orders.  DVT prophylaxis:  SCD  Code Status:  FULL CODE  as per patient    Family Communication:   Family not at  Bedside    Disposition Plan:     To home once workup is complete and patient is stable   Consults called: ID Comer  Admission status:   inpatient       Level of care   medical floor        Hulen Mandler 04/30/2016, 8:16 PM    [facility]  Pager [phone]   after 2 AM please page floor coverage PA If 7AM-7PM, please contact the day team taking care of the patient  [email or web address]  Password [email or web address]

## 2016-04-30 NOTE — ED Provider Notes (Signed)
CSN: [id number]     Arrival date & time 04/30/16  1151 History   First MD Initiated Contact with Patient 04/30/16 1222     Chief Complaint  Patient presents with  . Migraine  . Emesis     (Consider location/radiation/quality/duration/timing/severity/associated sxs/prior Treatment) HPI Tyler Shannon is a 25 y.o. male with history of HIV infection and hypokalemia, presents to emergency department complaining of headache. Patient states headache started yesterday morning. He denies any neck pain or stiffness or photophobia. He denies known fever. States pain is behind his eyes. He states that headache continued through this morning, and she also developed nausea and vomiting. He states he is unable to keep anything down. Reports mild abdominal pain. Denies any back pain. Denies any cough or congestion. No shortness of breath. No sinus pain or pressure. He has not taken any medications to help his pain. Denies any prior surgeries. States he is currently on antivirals but has not taken any in several last weeks.  Past Medical History  Diagnosis Date  . Hypokalemia 05/14/13  . HIV infection [facility](HCC)    Past Surgical History  Procedure Laterality Date  . Tonsillectomy and adenoidectomy  2007   Family History  Problem Relation Age of Onset  . Hypertension Mother   . Asthma Sister   . Arthritis Maternal Grandmother   . Hypertension Maternal Grandmother   . Diabetes Maternal Grandmother   . Cancer Maternal Grandfather     lung ca   Social History  Substance Use Topics  . Smoking status: Never Smoker   . Smokeless tobacco: Never Used  . Alcohol Use: Yes     Comment: Occassionally has a drink - beer or mixed drink    Review of Systems  Constitutional: Negative for fever and chills.  Eyes: Negative for photophobia.  Respiratory: Negative for cough, chest tightness and shortness of breath.   Cardiovascular: Negative for chest pain, palpitations and leg swelling.  Gastrointestinal:  Positive for nausea, vomiting and abdominal pain. Negative for diarrhea and abdominal distention.  Genitourinary: Negative for dysuria, urgency, frequency and hematuria.  Musculoskeletal: Negative for myalgias, arthralgias, neck pain and neck stiffness.  Skin: Negative for rash.  Allergic/Immunologic: Negative for immunocompromised state.  Neurological: Positive for headaches. Negative for dizziness, weakness, light-headedness and numbness.  All other systems reviewed and are negative.     Allergies  Lorabid  Home Medications   Prior to Admission medications   Medication Sig Start Date End Date Taking? Authorizing Provider  acetaminophen (TYLENOL) 500 MG tablet Take 500 mg by mouth every 6 (six) hours as needed for mild pain, moderate pain, fever or headache.   Yes Historical Provider, MD  ibuprofen (ADVIL,MOTRIN) 200 MG tablet Take 600 mg by mouth every 4 (four) hours as needed for fever, headache, mild pain, moderate pain or cramping.   Yes Historical Provider, MD  elvitegravir-cobicistat-emtricitabine-tenofovir (STRIBILD) 150-150-200-300 MG TABS tablet Take 1 tablet by mouth daily. Take with food Patient not taking: Reported on 04/30/2016 08/28/14   Gardiner Barefootobert W Comer, MD  traMADol (ULTRAM) 50 MG tablet Take 1 tablet (50 mg total) by mouth every 6 (six) hours as needed. Patient not taking: Reported on 04/30/2016 03/16/16   Cari B Triplett, FNP   BP 132/71 mmHg  Pulse 121  Temp(Src) 100.9 F (38.3 C) (Oral)  Resp 18  Ht 5\' 8"  (1.727 m)  Wt 72.576 kg  BMI 24.33 kg/m2  SpO2 96% Physical Exam  Constitutional: He is oriented to person, place, and time. He  appears well-developed and well-nourished. No distress.  HENT:  Head: Normocephalic and atraumatic.  Right Ear: External ear normal.  Left Ear: External ear normal.  Nose: Nose normal.  Mouth/Throat: Oropharynx is clear and moist.  Eyes: Conjunctivae and EOM are normal. Pupils are equal, round, and reactive to light.  Neck: Normal  range of motion. Neck supple.  No meningismus  Cardiovascular: Normal rate, regular rhythm and normal heart sounds.   Pulmonary/Chest: Effort normal. No respiratory distress. He has no wheezes. He has no rales.  Abdominal: Soft. Bowel sounds are normal. He exhibits no distension. There is tenderness. There is no rebound and no guarding.  Right lower quadrant and left lower quadrant tenderness  Musculoskeletal: He exhibits no edema.  Neurological: He is alert and oriented to person, place, and time.  Skin: Skin is warm and dry.  Nursing note and vitals reviewed.   ED Course  .Lumbar Puncture Date/Time: 04/30/2016 7:25 PM Performed by: Jaynie Crumble Authorized by: Jaynie Crumble Consent: Verbal consent obtained. Written consent obtained. Risks and benefits: risks, benefits and alternatives were discussed Consent given by: patient Patient understanding: patient states understanding of the procedure being performed Patient consent: the patient's understanding of the procedure matches consent given Procedure consent: procedure consent matches procedure scheduled Relevant documents: relevant documents present and verified Test results: test results available and properly labeled Site marked: the operative site was marked Imaging studies: imaging studies available Required items: required blood products, implants, devices, and special equipment available Patient identity confirmed: verbally with patient, arm band and provided demographic data Time out: Immediately prior to procedure a "time out" was called to verify the correct patient, procedure, equipment, support staff and site/side marked as required. Indications: evaluation for infection Anesthesia: local infiltration Local anesthetic: lidocaine 1% without epinephrine Anesthetic total: 8 ml Patient sedated: no Preparation: Patient was prepped and draped in the usual sterile fashion. Lumbar space: L4-L5  interspace Patient's position: left lateral decubitus Needle gauge: 20 Needle type: spinal needle - Quincke tip Needle length: 3.5 in Number of attempts: 2 Opening pressure: 25 cm H2O Fluid appearance: clear Tubes of fluid: 4 Total volume: 12 ml Post-procedure: site cleaned, pressure dressing applied and adhesive bandage applied Patient tolerance: Patient tolerated the procedure well with no immediate complications   (including critical care time) Labs Review Labs Reviewed  COMPREHENSIVE METABOLIC PANEL - Abnormal; Notable for the following:    Potassium 3.4 (*)    CO2 21 (*)    Glucose, Bld 115 (*)    Total Protein 9.0 (*)    AST 113 (*)    ALT 207 (*)    Alkaline Phosphatase 155 (*)    All other components within normal limits  CBC - Abnormal; Notable for the following:    Hemoglobin 12.9 (*)    HCT 38.5 (*)    All other components within normal limits  URINE CULTURE  LIPASE, BLOOD  URINALYSIS, ROUTINE W REFLEX MICROSCOPIC (NOT AT [facility])  I-STAT CG4 LACTIC ACID, ED    Imaging Review Dg Chest 2 View  04/30/2016  CLINICAL DATA:  Fever, migraines, weakness, cold, chills, and vomiting beginning yesterday, former smoker, HIV, history pneumonia EXAM: CHEST  2 VIEW COMPARISON:  11/21/2015 FINDINGS: Normal heart size, mediastinal contours, and pulmonary vascularity. Lungs clear. No pleural effusion or pneumothorax. Bones unremarkable. IMPRESSION: No acute abnormalities. Electronically Signed   By: Ulyses Southward M.D.   On: 04/30/2016 12:47   I have personally reviewed and evaluated these images and lab results as part  of my medical decision-making.   EKG Interpretation None       MDM   Final diagnoses:  Viral meningitis   Patient in emergency department with fever, headache, onset yesterday. Today with some nausea and vomiting. Patient initially told me he is compliant with his antiviral medications, however he told Dr. Madilyn Hookees that he has not had them in several weeks. His  last CD4 count was 720 4 months ago. Will start IV fluids, check labs. On exam no meningismus.   Labs showed unremarkable CBC, mildly elevated LFTs. CT abdomen and pelvis is negative. CT head negative. Patient feels slightly better after Compazine and IV fluids. Pt also seen by Dr. Madilyn Hookees, discussed with her. At this time no explanation for his fever and headache. Will do LP to ro meningitis.   7:27 PM LP with elevated WBCs. No organism seen on smear. Covered with antibiotics and antiviral given patient's compromised status. Discussed results with patient and his mother. Will admit for monitor and treatment. Patient agrees with the plan.  Discussed with hospitalist who will admit.   Filed Vitals:   04/30/16 1348 04/30/16 1526 04/30/16 1800 04/30/16 1924  BP: 139/83  130/68 120/82  Pulse: 108  108 109  Temp: 98.7 F (37.1 C) 100 F (37.8 C)  99.9 F (37.7 C)  TempSrc: Oral Oral  Oral  Resp: 17  18 16   Height:      Weight:      SpO2: 100%  98% 99%      Jaynie Crumbleatyana Jwan Hornbaker, PA-C 04/30/16 2115  Tilden FossaElizabeth Rees, MD 05/02/16 [phone]

## 2016-04-30 NOTE — ED Notes (Signed)
Bed: WA03 Expected date:  Expected time:  Means of arrival:  Comments: Hold for triage 1 

## 2016-04-30 NOTE — ED Notes (Signed)
Bed: WA24 Expected date:  Expected time:  Means of arrival:  Comments: Hold for triage 1 

## 2016-04-30 NOTE — ED Notes (Signed)
Lab results given to provider

## 2016-05-01 ENCOUNTER — Encounter (HOSPITAL_COMMUNITY): Payer: Self-pay

## 2016-05-01 LAB — COMPREHENSIVE METABOLIC PANEL
ALK PHOS: 118 U/L (ref 38–126)
ALT: 127 U/L — AB (ref 17–63)
AST: 45 U/L — ABNORMAL HIGH (ref 15–41)
Albumin: 3.6 g/dL (ref 3.5–5.0)
Anion gap: 4 — ABNORMAL LOW (ref 5–15)
BILIRUBIN TOTAL: 0.6 mg/dL (ref 0.3–1.2)
BUN: 5 mg/dL — AB (ref 6–20)
CALCIUM: 8.7 mg/dL — AB (ref 8.9–10.3)
CO2: 25 mmol/L (ref 22–32)
Chloride: 112 mmol/L — ABNORMAL HIGH (ref 101–111)
Creatinine, Ser: 0.66 mg/dL (ref 0.61–1.24)
GFR calc Af Amer: >60 mL/min (ref >60)
GFR calc non Af Amer: >60 mL/min (ref >60)
GLUCOSE: 144 mg/dL — AB (ref 65–99)
Potassium: 3.6 mmol/L (ref 3.5–5.1)
Sodium: 141 mmol/L (ref 135–145)
TOTAL PROTEIN: 7.5 g/dL (ref 6.5–8.1)

## 2016-05-01 LAB — HERPES SIMPLEX VIRUS(HSV) DNA BY PCR
HSV 1 DNA: NEGATIVE
HSV 2 DNA: NEGATIVE

## 2016-05-01 LAB — T-HELPER CELLS (CD4) COUNT (NOT AT ARMC)
CD4 T CELL HELPER: 34 % (ref 33–55)
CD4 T Cell Abs: 390 /uL — ABNORMAL LOW (ref 400–2700)

## 2016-05-01 LAB — CBC
HEMATOCRIT: 34.7 % — AB (ref 39.0–52.0)
Hemoglobin: 11.7 g/dL — ABNORMAL LOW (ref 13.0–17.0)
MCH: 27.5 pg (ref 26.0–34.0)
MCHC: 33.7 g/dL (ref 30.0–36.0)
MCV: 81.5 fL (ref 78.0–100.0)
Platelets: 229 10*3/uL (ref 150–400)
RBC: 4.26 MIL/uL (ref 4.22–5.81)
RDW: 13.3 % (ref 11.5–15.5)
WBC: 6.5 10*3/uL (ref 4.0–10.5)

## 2016-05-01 LAB — CSF CELL COUNT WITH DIFFERENTIAL
Eosinophils, CSF: 0 % (ref 0–1)
Lymphs, CSF: 75 % (ref 40–80)
Monocyte-Macrophage-Spinal Fluid: 19 % (ref 15–45)
RBC Count, CSF: 2 /mm3 — ABNORMAL HIGH
Segmented Neutrophils-CSF: 6 % (ref 0–6)
Tube #: 4
WBC, CSF: 347 /mm3 (ref 0–5)

## 2016-05-01 LAB — PHOSPHORUS: Phosphorus: 1.8 mg/dL — ABNORMAL LOW (ref 2.5–4.6)

## 2016-05-01 LAB — T4, FREE: FREE T4: 0.87 ng/dL (ref 0.61–1.12)

## 2016-05-01 LAB — URINE CULTURE: CULTURE: NO GROWTH

## 2016-05-01 LAB — TSH: TSH: 0.239 u[IU]/mL — AB (ref 0.350–4.500)

## 2016-05-01 LAB — PATHOLOGIST SMEAR REVIEW

## 2016-05-01 LAB — MAGNESIUM: Magnesium: 1.9 mg/dL (ref 1.7–2.4)

## 2016-05-01 MED ORDER — ZOLPIDEM TARTRATE 5 MG PO TABS
5.0000 mg | ORAL_TABLET | Freq: Once | ORAL | Status: AC
  Administered 2016-05-01: 5 mg via ORAL
  Filled 2016-05-01: qty 1

## 2016-05-01 NOTE — Progress Notes (Signed)
PROGRESS NOTE  Tyler Shannon [id number] DOB: 02-07-1991 DOA: 04/30/2016 PCP: Loel Loftyacquel M Rey, NP  HPI/Recap of past 24 hours: Tyler Salonody L Korol is a 25 y.o. male with medical history significant of HIV infection noncompliance, genital herpes, scabies infestation presented with  2 days of headache no light sensitivity but nausea and associated vomiting.  Feeling better this AM, no headache, nausea, eye pain.   Assessment/Plan: Active Problems:   HIV disease (HCC)   Meningitis   Viral meningitis  . HIV disease (HCC) - ID today see today, last night they recommended holding off on his home medications. CD4 count and HIV viral load pending. . Viral meningitis - for now continue antibiotic coverage with vanco and ceftriaxone as well as antiviral coverage.   Cont droplet precautions.  Code Status: FULL   Family Communication: None   Disposition Plan: Home when better    Consultants:  ID   Procedures:  LP 6/29   Antimicrobials:  Acyclovir  Vancomycin  rocephin    Objective: Filed Vitals:   04/30/16 1924 04/30/16 2018 04/30/16 2058 05/01/16 0447  BP: 120/82 110/47 132/78 125/70  Pulse: 109 115 102 94  Temp: 99.9 F (37.7 C)  98.5 F (36.9 C) 98.5 F (36.9 C)  TempSrc: Oral  Oral Oral  Resp: 16 20 20 18   Height:   5\' 8"  (1.727 m)   Weight:   73.1 kg (161 lb 2.5 oz)   SpO2: 99% 100% 100% 99%    Intake/Output Summary (Last 24 hours) at 05/01/16 0751 Last data filed at 05/01/16 0447  Gross per 24 hour  Intake    420 ml  Output      0 ml  Net    420 ml   Filed Weights   04/30/16 1211 04/30/16 2058  Weight: 72.576 kg (160 lb) 73.1 kg (161 lb 2.5 oz)    Exam: General:  Alert, oriented, calm, in no acute distress Eyes: pupils round and reactive to light and accomodation, clear sclerea Neck: supple, no masses, trachea mildline  Cardiovascular: RRR, no murmurs or rubs, no peripheral edema  Respiratory: clear to auscultation bilaterally, no wheezes, no  crackles  Abdomen: soft, nontender, nondistended, normal bowel tones heard  Skin: dry, no rashes  Musculoskeletal: no joint effusions, normal range of motion  Psychiatric: appropriate affect, normal speech  Neurologic: extraocular muscles intact, clear speech, moving all extremities with intact sensorium    Data Reviewed: CBC:  Recent Labs Lab 04/30/16 1205 04/30/16 2121 05/01/16 0332  WBC 8.2 7.1 6.5  NEUTROABS 6.5 5.8  --   HGB 12.9* 11.8* 11.7*  HCT 38.5* 34.2* 34.7*  MCV 80.7 79.2 81.5  PLT 234 213 229   Basic Metabolic Panel:  Recent Labs Lab 04/30/16 1205 04/30/16 2121 05/01/16 0332  NA 139 140 141  K 3.4* 3.4* 3.6  CL 108 110 112*  CO2 21* 23 25  GLUCOSE 115* 127* 144*  BUN 9 6 5*  CREATININE 0.74 0.69 0.66  CALCIUM 9.8 8.7* 8.7*  MG  --   --  1.9  PHOS  --   --  1.8*   GFR: Estimated Creatinine Clearance: 137.8 mL/min (by C-G formula based on Cr of 0.66). Liver Function Tests:  Recent Labs Lab 04/30/16 1205 04/30/16 2121 05/01/16 0332  AST 113* 61* 45*  ALT 207* 152* 127*  ALKPHOS 155* 123 118  BILITOT 0.7 0.6 0.6  PROT 9.0* 7.6 7.5  ALBUMIN 4.7 3.9 3.6    Recent Labs Lab 04/30/16  1205  LIPASE 15   No results for input(s): AMMONIA in the last 168 hours. Coagulation Profile: No results for input(s): INR, PROTIME in the last 168 hours. Cardiac Enzymes: No results for input(s): CKTOTAL, CKMB, CKMBINDEX, TROPONINI in the last 168 hours. BNP (last 3 results) No results for input(s): PROBNP in the last 8760 hours. HbA1C: No results for input(s): HGBA1C in the last 72 hours. CBG: No results for input(s): GLUCAP in the last 168 hours. Lipid Profile: No results for input(s): CHOL, HDL, LDLCALC, TRIG, CHOLHDL, LDLDIRECT in the last 72 hours. Thyroid Function Tests:  Recent Labs  05/01/16 0332  TSH 0.239*   Anemia Panel: No results for input(s): VITAMINB12, FOLATE, FERRITIN, TIBC, IRON, RETICCTPCT in the last 72 hours. Urine  analysis:    Component Value Date/Time   COLORURINE AMBER* 04/30/2016 1349   APPEARANCEUR CLEAR 04/30/2016 1349   LABSPEC 1.028 04/30/2016 1349   PHURINE 8.5* 04/30/2016 1349   GLUCOSEU NEGATIVE 04/30/2016 1349   HGBUR NEGATIVE 04/30/2016 1349   BILIRUBINUR NEGATIVE 04/30/2016 1349   KETONESUR NEGATIVE 04/30/2016 1349   PROTEINUR 30* 04/30/2016 1349   UROBILINOGEN 1 05/31/2014 0954   NITRITE NEGATIVE 04/30/2016 1349   LEUKOCYTESUR NEGATIVE 04/30/2016 1349   Sepsis Labs: @LABRCNTIP (procalcitonin:4,lacticidven:4)  ) Recent Results (from the past 240 hour(s))  CSF culture     Status: None (Preliminary result)   Collection Time: 04/30/16  4:41 PM  Result Value Ref Range Status   Specimen Description CSF  Final   Special Requests Immunocompromised  Final   Gram Stain   Final    NO ORGANISMS SEEN WBC PRESENT, PREDOMINANTLY MONONUCLEAR Gram Stain Report Called to,Read Back By and Verified With: L.JOHNSON AT 1804 ON 04/30/16 BY S.VANHOORNE    Culture PENDING  Incomplete   Report Status PENDING  Incomplete      Studies: Dg Chest 2 View  04/30/2016  CLINICAL DATA:  Fever, migraines, weakness, cold, chills, and vomiting beginning yesterday, former smoker, HIV, history pneumonia EXAM: CHEST  2 VIEW COMPARISON:  11/21/2015 FINDINGS: Normal heart size, mediastinal contours, and pulmonary vascularity. Lungs clear. No pleural effusion or pneumothorax. Bones unremarkable. IMPRESSION: No acute abnormalities. Electronically Signed   By: Ulyses SouthwardMark  Boles M.D.   On: 04/30/2016 12:47   Ct Head Wo Contrast  04/30/2016  CLINICAL DATA:  Headache that began yesterday, history HIV EXAM: CT HEAD WITHOUT CONTRAST TECHNIQUE: Contiguous axial images were obtained from the base of the skull through the vertex without intravenous contrast. COMPARISON:  None FINDINGS: Normal ventricular morphology. No midline shift or mass effect. Normal appearance of brain parenchyma. No intracranial hemorrhage, mass lesion, or  acute infarction. Visualized paranasal sinuses and mastoid air cells clear. Bones unremarkable. IMPRESSION: Normal exam. Electronically Signed   By: Ulyses SouthwardMark  Boles M.D.   On: 04/30/2016 15:17   Ct Abdomen Pelvis W Contrast  04/30/2016  CLINICAL DATA:  Right lower quadrant pain and elevated LFTs EXAM: CT ABDOMEN AND PELVIS WITH CONTRAST TECHNIQUE: Multidetector CT imaging of the abdomen and pelvis was performed using the standard protocol following bolus administration of intravenous contrast. CONTRAST:  100mL ISOVUE-300 IOPAMIDOL (ISOVUE-300) INJECTION 61% COMPARISON:  None. FINDINGS: Lower chest:  No acute findings. Hepatobiliary: Diffuse fatty infiltration of the liver is noted. The gallbladder is within normal limits. Pancreas: No mass, inflammatory changes, or other significant abnormality. Spleen: Within normal limits in size and appearance. Adrenals/Urinary Tract: No masses identified. No evidence of hydronephrosis. Bladder is well distended. Stomach/Bowel: No evidence of obstruction, inflammatory process, or abnormal fluid  collections. The appendix is within normal limits. Vascular/Lymphatic: No pathologically enlarged lymph nodes. No evidence of abdominal aortic aneurysm. Reproductive: No mass or other significant abnormality. Other: None. Musculoskeletal:  No suspicious bone lesions identified. IMPRESSION: No acute abnormality noted. Electronically Signed   By: Alcide Clever M.D.   On: 04/30/2016 15:30    Scheduled Meds: . sodium chloride   Intravenous STAT  . acyclovir  10 mg/kg Intravenous Q8H  . cefTRIAXone (ROCEPHIN)  IV  2 g Intravenous Q12H  . vancomycin  1,000 mg Intravenous Q8H    Continuous Infusions:    LOS: 1 day   Time spent: 26 min  Jene Huq Vergie Living, MD [facility] Pager [phone]  If 7PM-7AM, please contact night-coverage [email or web address] Password [email or web address] 05/01/2016, 7:51 AM

## 2016-05-02 DIAGNOSIS — A879 Viral meningitis, unspecified: Principal | ICD-10-CM

## 2016-05-02 DIAGNOSIS — B20 Human immunodeficiency virus [HIV] disease: Secondary | ICD-10-CM

## 2016-05-02 DIAGNOSIS — G039 Meningitis, unspecified: Secondary | ICD-10-CM

## 2016-05-02 LAB — BASIC METABOLIC PANEL
Anion gap: 5 (ref 5–15)
BUN: <5 mg/dL — ABNORMAL LOW (ref 6–20)
CALCIUM: 8.3 mg/dL — AB (ref 8.9–10.3)
CO2: 28 mmol/L (ref 22–32)
CREATININE: 0.75 mg/dL (ref 0.61–1.24)
Chloride: 110 mmol/L (ref 101–111)
GFR calc non Af Amer: >60 mL/min (ref >60)
GLUCOSE: 105 mg/dL — AB (ref 65–99)
Potassium: 3.4 mmol/L — ABNORMAL LOW (ref 3.5–5.1)
Sodium: 143 mmol/L (ref 135–145)

## 2016-05-02 LAB — HIV-1 RNA QUANT-NO REFLEX-BLD
HIV 1 RNA QUANT: 169000 {copies}/mL
LOG10 HIV-1 RNA: 5.228 log10copy/mL

## 2016-05-02 LAB — CBC
HEMATOCRIT: 33.1 % — AB (ref 39.0–52.0)
Hemoglobin: 11.1 g/dL — ABNORMAL LOW (ref 13.0–17.0)
MCH: 27.8 pg (ref 26.0–34.0)
MCHC: 33.5 g/dL (ref 30.0–36.0)
MCV: 82.8 fL (ref 78.0–100.0)
Platelets: 211 10*3/uL (ref 150–400)
RBC: 4 MIL/uL — ABNORMAL LOW (ref 4.22–5.81)
RDW: 13.4 % (ref 11.5–15.5)
WBC: 4.8 10*3/uL (ref 4.0–10.5)

## 2016-05-02 LAB — T3, FREE: T3 FREE: 2.9 pg/mL (ref 2.0–4.4)

## 2016-05-02 NOTE — Progress Notes (Signed)
Patient d/c home. In good spirit.

## 2016-05-02 NOTE — Discharge Summary (Signed)
Physician Discharge Summary  Leonarda SalonCody L Summerall [id number] DOB: 1991/07/16 DOA: 04/30/2016  PCP: Loel Loftyacquel M Rey, NP  Admit date: 04/30/2016 Discharge date: 05/02/2016  Time spent: 35 minutes  Recommendations for Outpatient Follow-up:  1. Follow-up with ID in 2-4 weeks.   Discharge Diagnoses:  Active Problems:   HIV disease (HCC)   Meningitis   Viral meningitis   Discharge Condition: stable  Diet recommendation: Regular  Filed Weights   04/30/16 1211 04/30/16 2058  Weight: 72.576 kg (160 lb) 73.1 kg (161 lb 2.5 oz)    History of present illness:  25 y.o. male with medical history significant of HIV infection noncompliance, genital herpes, scabies infestation Presented with  2 days of headache no light sensitivity but nausea and associated vomiting.  Hospital Course:  HIV disease [facility](HCC): It was discussed with ID on admission and they recommended to hold off on HAART therapy. CD4 count 390. Resume her therapy after discussing with ID.  Viral meningitis: Lumbar puncture was done that showed a protein of 47 white blood cell of 267, glucose of 62 likely viral. HSV 1 and 2 were negative, cryptococcal antigen negative.  Procedures:  LP  Consultations:  None  Discharge Exam: Filed Vitals:   05/01/16 2043 05/02/16 0600  BP: 121/81 118/67  Pulse: 92 97  Temp: 98.6 F (37 C) 97.9 F (36.6 C)  Resp: 16 16    General: A&O x 3 Cardiovascular: RRR Respiratory: good air movement CTA B/L  Discharge Instructions   Discharge Instructions    Diet - low sodium heart healthy    Complete by:  As directed      Increase activity slowly    Complete by:  As directed           Current Discharge Medication List    CONTINUE these medications which have NOT CHANGED   Details  ibuprofen (ADVIL,MOTRIN) 200 MG tablet Take 600 mg by mouth every 4 (four) hours as needed for fever, headache, mild pain, moderate pain or cramping.    elvitegravir-cobicistat-emtricitabine-tenofovir  (STRIBILD) 150-150-200-300 MG TABS tablet Take 1 tablet by mouth daily. Take with food Qty: 30 tablet, Refills: 5    traMADol (ULTRAM) 50 MG tablet Take 1 tablet (50 mg total) by mouth every 6 (six) hours as needed. Qty: 12 tablet, Refills: 0      STOP taking these medications     acetaminophen (TYLENOL) 500 MG tablet        Allergies  Allergen Reactions  . Lorabid [Loracarbef] Rash      The results of significant diagnostics from this hospitalization (including imaging, microbiology, ancillary and laboratory) are listed below for reference.    Significant Diagnostic Studies: Dg Chest 2 View  04/30/2016  CLINICAL DATA:  Fever, migraines, weakness, cold, chills, and vomiting beginning yesterday, former smoker, HIV, history pneumonia EXAM: CHEST  2 VIEW COMPARISON:  11/21/2015 FINDINGS: Normal heart size, mediastinal contours, and pulmonary vascularity. Lungs clear. No pleural effusion or pneumothorax. Bones unremarkable. IMPRESSION: No acute abnormalities. Electronically Signed   By: Ulyses SouthwardMark  Boles M.D.   On: 04/30/2016 12:47   Ct Head Wo Contrast  04/30/2016  CLINICAL DATA:  Headache that began yesterday, history HIV EXAM: CT HEAD WITHOUT CONTRAST TECHNIQUE: Contiguous axial images were obtained from the base of the skull through the vertex without intravenous contrast. COMPARISON:  None FINDINGS: Normal ventricular morphology. No midline shift or mass effect. Normal appearance of brain parenchyma. No intracranial hemorrhage, mass lesion, or acute infarction. Visualized paranasal sinuses and mastoid air  cells clear. Bones unremarkable. IMPRESSION: Normal exam. Electronically Signed   By: Ulyses Southward M.D.   On: 04/30/2016 15:17   Ct Abdomen Pelvis W Contrast  04/30/2016  CLINICAL DATA:  Right lower quadrant pain and elevated LFTs EXAM: CT ABDOMEN AND PELVIS WITH CONTRAST TECHNIQUE: Multidetector CT imaging of the abdomen and pelvis was performed using the standard protocol following bolus  administration of intravenous contrast. CONTRAST:  ISOVUE-300 IOPAMIDOL (ISOVUE-300) INJECTION 61% COMPARISON:  None. FINDINGS: Lower chest:  No acute findings. Hepatobiliary: Diffuse fatty infiltration of the liver is noted. The gallbladder is within normal limits. Pancreas: No mass, inflammatory changes, or other significant abnormality. Spleen: Within normal limits in size and appearance. Adrenals/Urinary Tract: No masses identified. No evidence of hydronephrosis. Bladder is well distended. Stomach/Bowel: No evidence of obstruction, inflammatory process, or abnormal fluid collections. The appendix is within normal limits. Vascular/Lymphatic: No pathologically enlarged lymph nodes. No evidence of abdominal aortic aneurysm. Reproductive: No mass or other significant abnormality. Other: None. Musculoskeletal:  No suspicious bone lesions identified. IMPRESSION: No acute abnormality noted. Electronically Signed   By: Alcide Clever M.D.   On: 04/30/2016 15:30    Microbiology: Recent Results (from the past 240 hour(s))  Urine culture     Status: None   Collection Time: 04/30/16  1:49 PM  Result Value Ref Range Status   Specimen Description URINE, CLEAN CATCH  Final   Special Requests NONE  Final   Culture NO GROWTH Performed at [facility]   Final   Report Status 05/01/2016 FINAL  Final  CSF culture     Status: None (Preliminary result)   Collection Time: 04/30/16  4:41 PM  Result Value Ref Range Status   Specimen Description CSF  Final   Special Requests Immunocompromised  Final   Gram Stain   Final    NO ORGANISMS SEEN WBC PRESENT, PREDOMINANTLY MONONUCLEAR Gram Stain Report Called to,Read Back By and Verified With: L.JOHNSON AT 1804 ON 04/30/16 BY S.VANHOORNE    Culture   Final    NO GROWTH 2 DAYS Performed at [facility]    Report Status PENDING  Incomplete     Labs: Basic Metabolic Panel:  Recent Labs Lab 04/30/16 1205 04/30/16 2121 05/01/16 0332  05/02/16 0412  NA 139 140 141 143  K 3.4* 3.4* 3.6 3.4*  CL 108 110 112* 110  CO2 21* GLUCOSE 115* 127* 144* 105*  BUN 9 6 5* <5*  CREATININE 0.74 0.69 0.66 0.75  CALCIUM 9.8 8.7* 8.7* 8.3*  MG  --   --  1.9  --   PHOS  --   --  1.8*  --    Liver Function Tests:  Recent Labs Lab 04/30/16 1205 04/30/16 2121 05/01/16 0332  AST 113* 61* 45*  ALT 207* 152* 127*  ALKPHOS 155* 123 118  BILITOT 0.7 0.6 0.6  PROT 9.0* 7.6 7.5  ALBUMIN 4.7 3.9 3.6    Recent Labs Lab 04/30/16 1205  LIPASE 15   No results for input(s): AMMONIA in the last 168 hours. CBC:  Recent Labs Lab 04/30/16 1205 04/30/16 2121 05/01/16 0332 05/02/16 0412  WBC 8.2 7.1 6.5 4.8  NEUTROABS 6.5 5.8  --   --   HGB 12.9* 11.8* 11.7* 11.1*  HCT 38.5* 34.2* 34.7* 33.1*  MCV 80.7 79.2 81.5 82.8  PLT 234 213 229 211   Cardiac Enzymes: No results for input(s): CKTOTAL, CKMB, CKMBINDEX, TROPONINI in the last 168 hours. BNP:  BNP (last 3 results) No results for input(s): BNP in the last 8760 hours.  ProBNP (last 3 results) No results for input(s): PROBNP in the last 8760 hours.  CBG: No results for input(s): GLUCAP in the last 168 hours.   Signed:  Marinda ElkFELIZ ORTIZ, ABRAHAM MD.  [facility] 05/02/2016, 10:34 AM

## 2016-05-02 NOTE — Progress Notes (Signed)
Patient d/c instructions given, verbalized understanding. All questions answered appropriately. Stable.

## 2016-05-04 ENCOUNTER — Telehealth: Payer: Self-pay | Admitting: *Deleted

## 2016-05-04 LAB — CSF CULTURE W GRAM STAIN

## 2016-05-04 LAB — CSF CULTURE
CULTURE: NO GROWTH
GRAM STAIN: NONE SEEN

## 2016-05-04 NOTE — Telephone Encounter (Signed)
-----   Message from Gardiner Barefootobert W Comer, MD sent at 05/02/2016 10:29 AM EDT ----- This pt was hospitalized for viral meningitis.  Now discharged.  Is HIV positive and had not been back to clinic for over 1 year and needs to get back in.  Apparently couldn't afford the copay, though looks like he has [employer] so shouldn't be one.   thanks

## 2016-05-04 NOTE — Telephone Encounter (Signed)
Left message for patient asking him to call back and schedule an appointment to get back into care. Andree CossHowell, Verlaine Embry M, RN

## 2016-05-06 LAB — CULTURE, BLOOD (ROUTINE X 2)
Culture: NO GROWTH
Culture: NO GROWTH

## 2016-06-03 ENCOUNTER — Inpatient Hospital Stay: Payer: 59 | Admitting: Internal Medicine

## 2016-06-05 ENCOUNTER — Inpatient Hospital Stay: Payer: 59 | Admitting: Internal Medicine

## 2017-04-03 IMAGING — CR DG CHEST 2V
2 series · 2 of 2 positions shown · non-contrast
Comparison: None.

CLINICAL DATA: 24-year-old male with difficulty breathing

EXAM:
CHEST  2 VIEW

[chest pa]
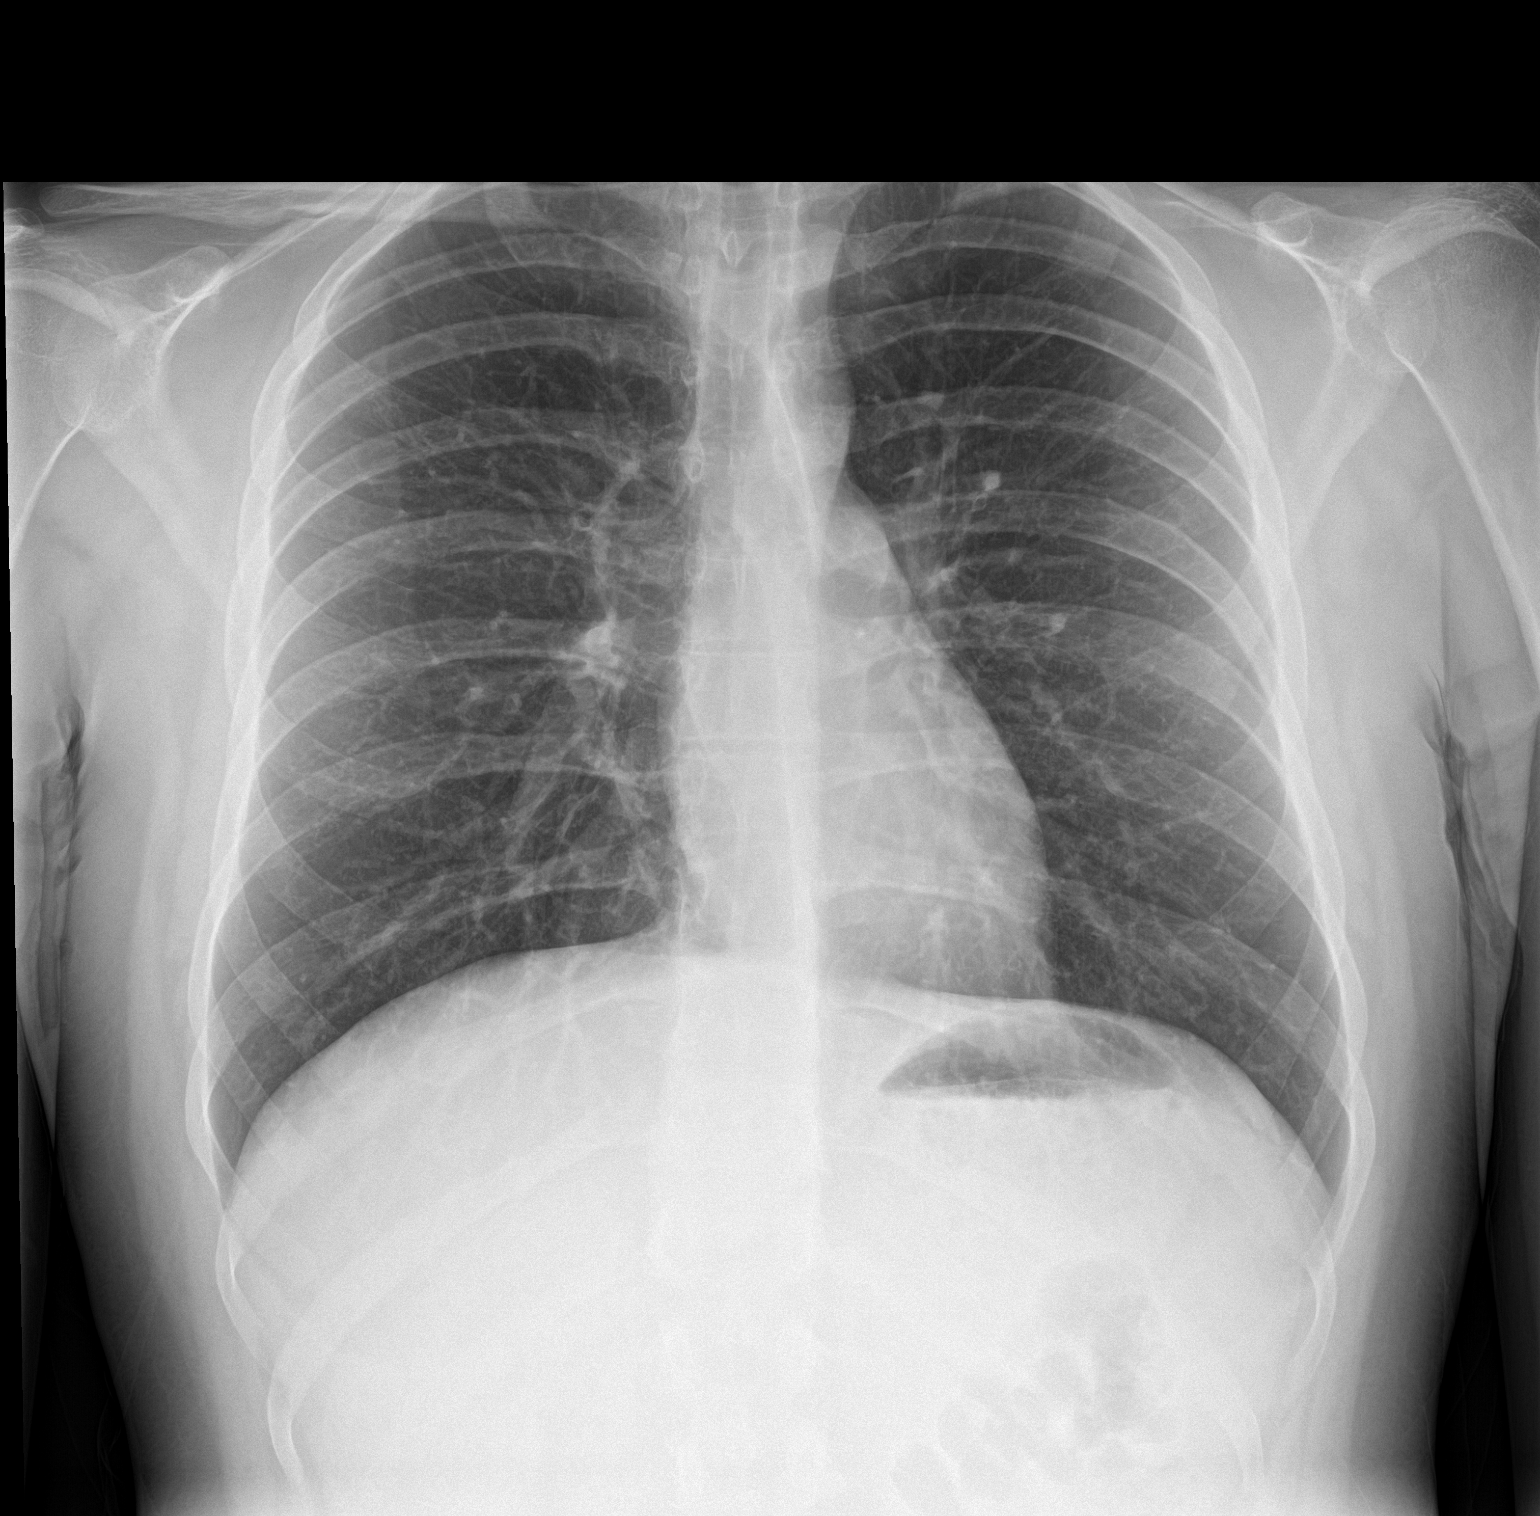

[chest lat]
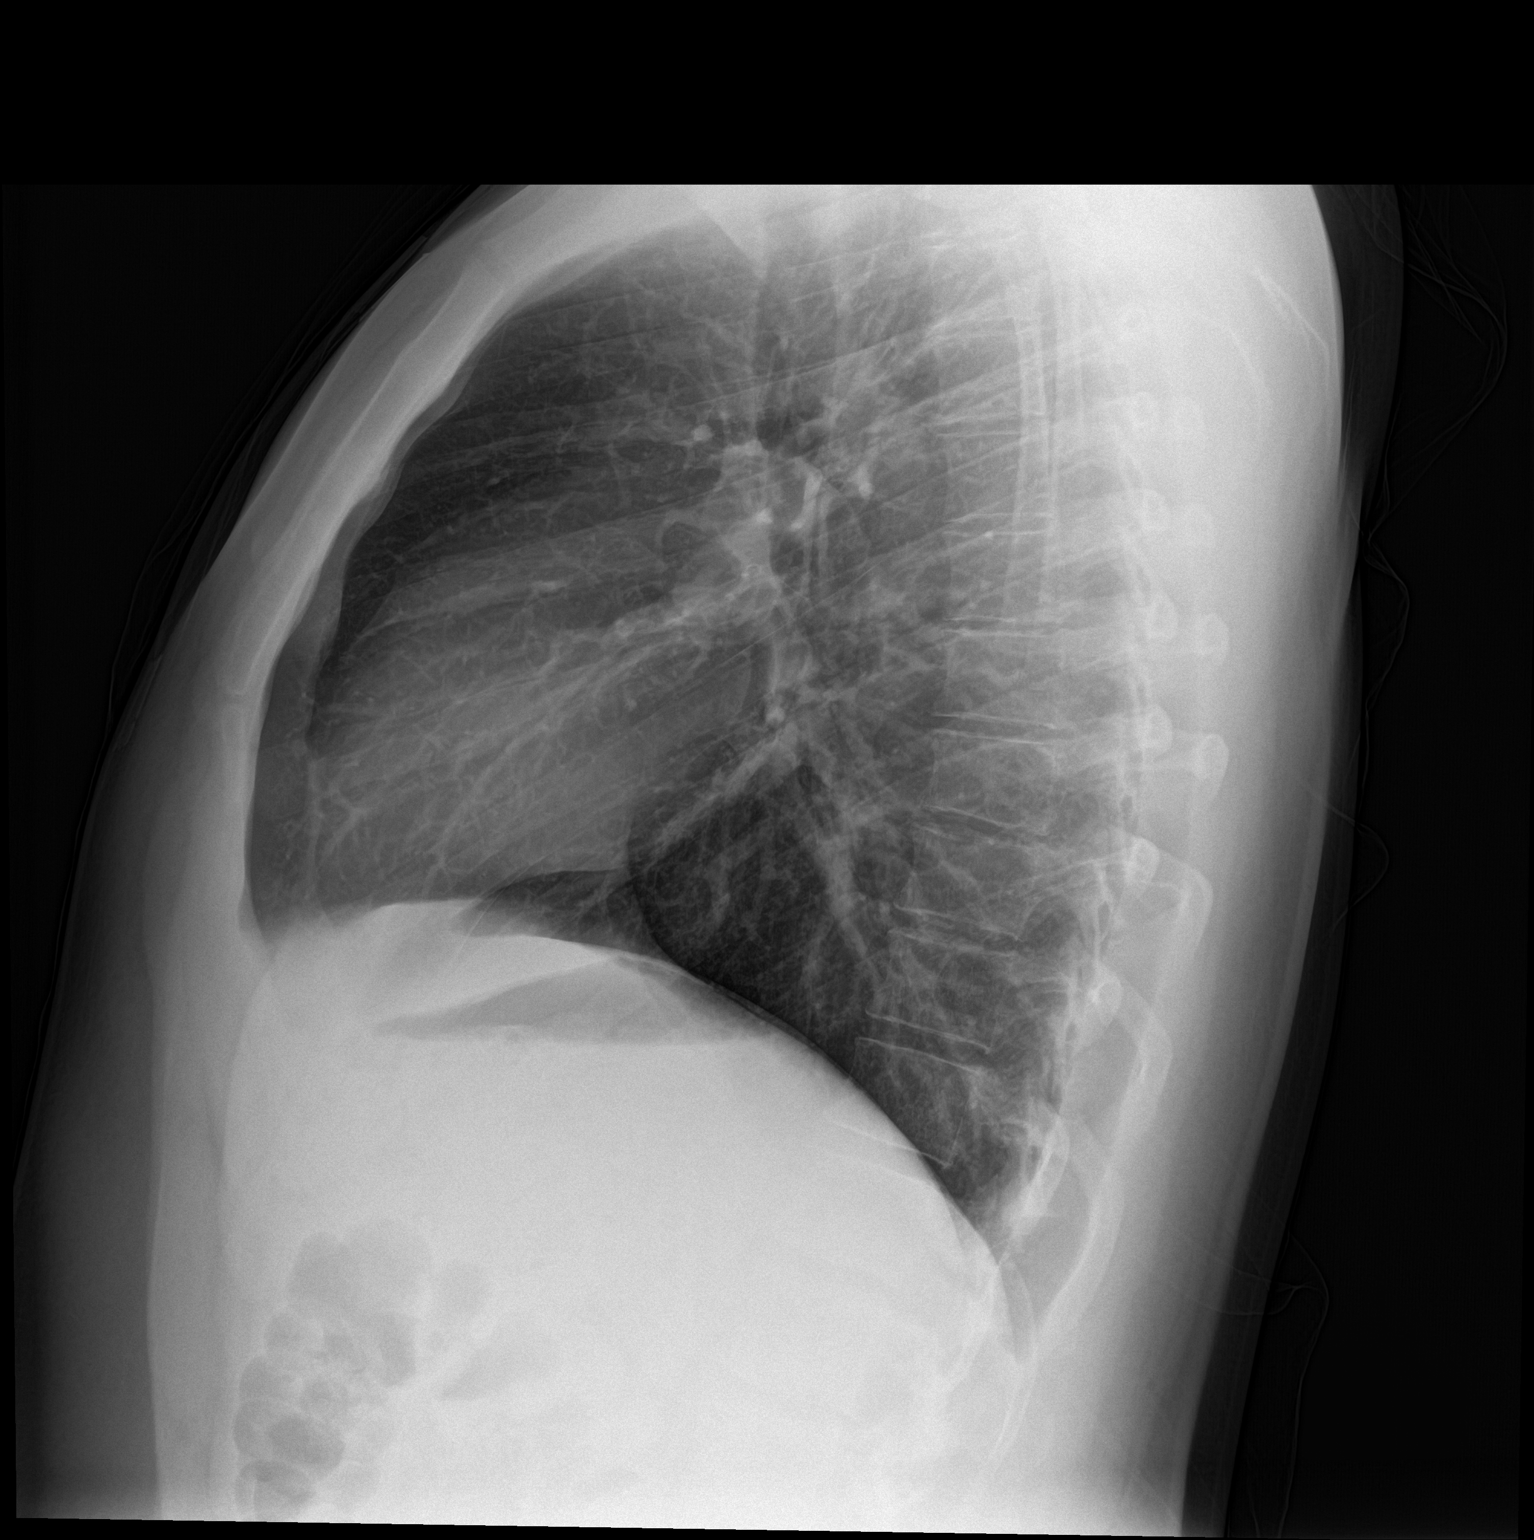

[2 of 2 positions shown; findings below may reference images not displayed]

FINDINGS: The heart size and mediastinal contours are within normal limits.
Both lungs are clear. The visualized skeletal structures are
unremarkable.
IMPRESSION: No active cardiopulmonary disease.

## 2017-11-15 DIAGNOSIS — B20 Human immunodeficiency virus [HIV] disease: Secondary | ICD-10-CM | POA: Diagnosis not present

## 2017-11-15 DIAGNOSIS — B028 Zoster with other complications: Secondary | ICD-10-CM | POA: Diagnosis not present

## 2021-05-19 ENCOUNTER — Other Ambulatory Visit: Payer: Self-pay | Admitting: Podiatry
